# Patient Record
Sex: Female | Born: 1965 | Race: White | Hispanic: No | Smoking: Former smoker
Health system: Southern US, Community
[De-identification: ages and names within clinical notes are randomized; demographics above are authoritative.]

## PROBLEM LIST (undated history)

## (undated) DIAGNOSIS — F419 Anxiety disorder, unspecified: Secondary | ICD-10-CM

## (undated) DIAGNOSIS — F329 Major depressive disorder, single episode, unspecified: Secondary | ICD-10-CM

## (undated) DIAGNOSIS — F32A Depression, unspecified: Secondary | ICD-10-CM

## (undated) DIAGNOSIS — F319 Bipolar disorder, unspecified: Secondary | ICD-10-CM

---

## 1997-03-11 HISTORY — PX: CARPAL TUNNEL RELEASE: SHX101

## 2006-10-03 ENCOUNTER — Ambulatory Visit: Payer: Self-pay | Admitting: Internal Medicine

## 2006-10-03 DIAGNOSIS — F458 Other somatoform disorders: Secondary | ICD-10-CM

## 2006-10-03 DIAGNOSIS — J4520 Mild intermittent asthma, uncomplicated: Secondary | ICD-10-CM

## 2006-10-15 ENCOUNTER — Telehealth (INDEPENDENT_AMBULATORY_CARE_PROVIDER_SITE_OTHER): Payer: Self-pay | Admitting: *Deleted

## 2006-10-22 ENCOUNTER — Ambulatory Visit: Payer: Self-pay | Admitting: Internal Medicine

## 2006-11-29 ENCOUNTER — Inpatient Hospital Stay (HOSPITAL_COMMUNITY): Admission: EM | Admit: 2006-11-29 | Discharge: 2006-11-29 | Payer: Self-pay | Admitting: Emergency Medicine

## 2006-12-04 ENCOUNTER — Encounter (INDEPENDENT_AMBULATORY_CARE_PROVIDER_SITE_OTHER): Payer: Self-pay | Admitting: Internal Medicine

## 2006-12-04 LAB — CONVERTED CEMR LAB
BUN: 8 mg/dL (ref 6–23)
CO2: 23 meq/L (ref 19–32)
Chloride: 106 meq/L (ref 96–112)
Glucose, Bld: 116 mg/dL — ABNORMAL HIGH (ref 70–99)
Hemoglobin: 14.7 g/dL (ref 12.0–15.0)
Lymphocytes Relative: 37 % (ref 12–46)
Lymphs Abs: 3.6 10*3/uL — ABNORMAL HIGH (ref 0.7–3.3)
Monocytes Relative: 7 % (ref 3–11)
Neutro Abs: 5.4 10*3/uL (ref 1.7–7.7)
Neutrophils Relative %: 54 % (ref 43–77)
Potassium: 3.9 meq/L (ref 3.5–5.3)
RBC: 4.57 M/uL (ref 3.87–5.11)
WBC: 9.9 10*3/uL (ref 4.0–10.5)

## 2006-12-05 ENCOUNTER — Telehealth (INDEPENDENT_AMBULATORY_CARE_PROVIDER_SITE_OTHER): Payer: Self-pay | Admitting: Internal Medicine

## 2006-12-15 ENCOUNTER — Ambulatory Visit: Payer: Self-pay | Admitting: Internal Medicine

## 2006-12-15 DIAGNOSIS — Q742 Other congenital malformations of lower limb(s), including pelvic girdle: Secondary | ICD-10-CM

## 2006-12-16 ENCOUNTER — Telehealth (INDEPENDENT_AMBULATORY_CARE_PROVIDER_SITE_OTHER): Payer: Self-pay | Admitting: Internal Medicine

## 2006-12-25 ENCOUNTER — Ambulatory Visit: Payer: Self-pay | Admitting: Orthopedic Surgery

## 2006-12-25 ENCOUNTER — Encounter: Payer: Self-pay | Admitting: Orthopedic Surgery

## 2006-12-25 DIAGNOSIS — M21619 Bunion of unspecified foot: Secondary | ICD-10-CM | POA: Insufficient documentation

## 2007-01-02 ENCOUNTER — Ambulatory Visit: Payer: Self-pay | Admitting: Internal Medicine

## 2007-01-02 DIAGNOSIS — F5102 Adjustment insomnia: Secondary | ICD-10-CM

## 2007-03-09 ENCOUNTER — Ambulatory Visit: Payer: Self-pay | Admitting: Internal Medicine

## 2007-03-09 DIAGNOSIS — L820 Inflamed seborrheic keratosis: Secondary | ICD-10-CM

## 2007-03-09 DIAGNOSIS — L909 Atrophic disorder of skin, unspecified: Secondary | ICD-10-CM | POA: Insufficient documentation

## 2007-03-09 DIAGNOSIS — L919 Hypertrophic disorder of the skin, unspecified: Secondary | ICD-10-CM

## 2007-04-24 ENCOUNTER — Ambulatory Visit: Payer: Self-pay | Admitting: Internal Medicine

## 2007-04-24 DIAGNOSIS — R05 Cough: Secondary | ICD-10-CM

## 2007-04-24 DIAGNOSIS — R519 Headache, unspecified: Secondary | ICD-10-CM | POA: Insufficient documentation

## 2007-04-24 DIAGNOSIS — R51 Headache: Secondary | ICD-10-CM | POA: Insufficient documentation

## 2007-05-15 ENCOUNTER — Encounter (INDEPENDENT_AMBULATORY_CARE_PROVIDER_SITE_OTHER): Payer: Self-pay | Admitting: Internal Medicine

## 2007-05-18 ENCOUNTER — Encounter (INDEPENDENT_AMBULATORY_CARE_PROVIDER_SITE_OTHER): Payer: Self-pay | Admitting: Internal Medicine

## 2007-05-22 ENCOUNTER — Encounter (INDEPENDENT_AMBULATORY_CARE_PROVIDER_SITE_OTHER): Payer: Self-pay | Admitting: Internal Medicine

## 2007-05-25 ENCOUNTER — Encounter (INDEPENDENT_AMBULATORY_CARE_PROVIDER_SITE_OTHER): Payer: Self-pay | Admitting: Internal Medicine

## 2007-06-18 ENCOUNTER — Encounter (INDEPENDENT_AMBULATORY_CARE_PROVIDER_SITE_OTHER): Payer: Self-pay | Admitting: Internal Medicine

## 2008-08-04 ENCOUNTER — Encounter: Admission: RE | Admit: 2008-08-04 | Discharge: 2008-08-04 | Payer: Self-pay | Admitting: Obstetrics and Gynecology

## 2008-09-20 ENCOUNTER — Emergency Department (HOSPITAL_COMMUNITY): Admission: EM | Admit: 2008-09-20 | Discharge: 2008-09-20 | Payer: Self-pay | Admitting: Emergency Medicine

## 2008-10-22 ENCOUNTER — Emergency Department (HOSPITAL_BASED_OUTPATIENT_CLINIC_OR_DEPARTMENT_OTHER): Admission: EM | Admit: 2008-10-22 | Discharge: 2008-10-22 | Payer: Self-pay | Admitting: Emergency Medicine

## 2010-06-17 LAB — COMPREHENSIVE METABOLIC PANEL WITH GFR
ALT: 16 U/L (ref 0–35)
AST: 27 U/L (ref 0–37)
Alkaline Phosphatase: 116 U/L (ref 39–117)
CO2: 30 meq/L (ref 19–32)
Calcium: 9.5 mg/dL (ref 8.4–10.5)
GFR calc Af Amer: >60 mL/min (ref >60)
GFR calc non Af Amer: >60 mL/min (ref >60)
Glucose, Bld: 121 mg/dL — ABNORMAL HIGH (ref 70–99)
Potassium: 3.3 meq/L — ABNORMAL LOW (ref 3.5–5.1)
Sodium: 144 meq/L (ref 135–145)
Total Protein: 6.8 g/dL (ref 6.0–8.3)

## 2010-06-17 LAB — POCT TOXICOLOGY PANEL: Benzodiazepines: POSITIVE

## 2010-06-17 LAB — COMPREHENSIVE METABOLIC PANEL
Albumin: 4 g/dL (ref 3.5–5.2)
BUN: 7 mg/dL (ref 6–23)
Chloride: 114 mEq/L — ABNORMAL HIGH (ref 96–112)
Creatinine, Ser: 0.7 mg/dL (ref 0.4–1.2)
Total Bilirubin: 0.6 mg/dL (ref 0.3–1.2)

## 2010-06-17 LAB — URINALYSIS, ROUTINE W REFLEX MICROSCOPIC
Bilirubin Urine: NEGATIVE
Glucose, UA: NEGATIVE mg/dL
Hgb urine dipstick: NEGATIVE
Ketones, ur: NEGATIVE mg/dL
Nitrite: NEGATIVE
Protein, ur: NEGATIVE mg/dL
Specific Gravity, Urine: 1.006 (ref 1.005–1.030)
Urobilinogen, UA: 0.2 mg/dL (ref 0.0–1.0)
pH: 6 (ref 5.0–8.0)

## 2010-06-17 LAB — CBC
HCT: 45.5 % (ref 36.0–46.0)
Hemoglobin: 15.6 g/dL — ABNORMAL HIGH (ref 12.0–15.0)
MCHC: 34.2 g/dL (ref 30.0–36.0)
MCV: 97.7 fL (ref 78.0–100.0)
Platelets: 295 10*3/uL (ref 150–400)
RBC: 4.07 MIL/uL (ref 3.87–5.11)
RBC: 4.65 MIL/uL (ref 3.87–5.11)
RDW: 12.3 % (ref 11.5–15.5)
WBC: 13.6 10*3/uL — ABNORMAL HIGH (ref 4.0–10.5)
WBC: 16.4 10*3/uL — ABNORMAL HIGH (ref 4.0–10.5)

## 2010-06-17 LAB — DIFFERENTIAL
Lymphs Abs: 1.8 10*3/uL (ref 0.7–4.0)
Monocytes Relative: 3 % (ref 3–12)
Neutro Abs: 14.1 10*3/uL — ABNORMAL HIGH (ref 1.7–7.7)
Neutrophils Relative %: 86 % — ABNORMAL HIGH (ref 43–77)

## 2010-06-17 LAB — TSH: TSH: 1.579 u[IU]/mL (ref 0.350–4.500)

## 2010-06-17 LAB — T4, FREE: Free T4: 1.15 ng/dL (ref 0.80–1.80)

## 2010-06-17 LAB — PREGNANCY, URINE: Preg Test, Ur: NEGATIVE

## 2010-07-24 NOTE — H&P (Signed)
NAMECHARNAY, NAZARIO             ACCOUNT NO.:  [id number]   MEDICAL RECORD NO.:  [id number]          PATIENT TYPE:  INP   LOCATION:  1438                         FACILITY:  [facility]   PHYSICIAN:  Della Goo, M.D. DATE OF BIRTH:  1965/08/15   DATE OF ADMISSION:  11/28/2006  DATE OF DISCHARGE:  11/29/2006                              HISTORY & PHYSICAL   This is an unassigned patient.   CHIEF COMPLAINT:  Altered mental status.   HISTORY OF PRESENT ILLNESS:  This is a 45 year old female, who initially  was unable to give any history of her illness, secondary to minimal  responsiveness.  The patient was arousable at times.  She was brought by  ambulance to the emergency department from [facility].  The patient  had to be worked with and strongly encouraged to answer questions,  during her physical assessment.  She reported having complaints of  constipation and concerns of possibly being obstructed.  She reports  taking a laxative therapy of mag citrate and laxative pills, without  results.  She reports not having a bowel movement for 4 days.  The  patient denies having any nausea, vomiting.   The patient was evaluated in the emergency department by the emergency  room physician, who did a very thorough workup on the patient, without  the help of any medical history.  The patient was administered IV Narcan  x1 dose, without improvement.  The patient then was able to give her  history of having abdominal discomfort and bowel impaction.  She also  reported having a cough for over 3 weeks and taking over-the-counter  cough syrup.  She denied having any production of cough, and she  referred to this cough as being a smoker's cough.   PAST MEDICAL HISTORY:  None.   MEDICATIONS:  None, except for over-the-counter cough syrup.   ALLERGIES:  NO KNOWN DRUG ALLERGIES   SOCIAL HISTORY:  Smoker, nondrinker.   FAMILY HISTORY:  Noncontributory.   REVIEW OF SYSTEMS:  Pertinent for  mentioned above.   PHYSICAL EXAMINATION FINDINGS:  GENERAL:  At the time of my examination,  the patient was alert and oriented x3.  She is tearful and agitated but  in no acute distress.  VITAL SIGNS:  Temperature 97.8, blood pressure  135/80, heart rate 111-112, respirations 18, O2 sats 95% to 98% on room  air.  HEENT:  Examination normocephalic, atraumatic.  There is no  scleral icterus.  Pupils equally round, reactive to light.  Extraocular  muscles are intact.  Funduscopic benign.  Oropharynx is clear.  NECK:  Supple, full range of motion.  No thyromegaly, adenopathy,  jugular venous distention.  CARDIOVASCULAR:  Mild tachycardiac rate and rhythm.  No murmurs, gallops  or rubs.  LUNGS:  Mildly decreased breath sounds.  No rales, rhonchi or wheezes.  ABDOMEN:  Positive bowel sounds, but mildly hypoactive.  Nontender on  palpation.  No hepatosplenomegaly.  No rebound or guarding.  EXTREMITIES:  Without cyanosis, clubbing or edema.  NEUROLOGIC:  The patient now it is alert and oriented x3.  She is able  to move all  four of her extremities, and there are no focal deficits.   LABORATORY STUDIES:  Reveal a sodium level of 127, a potassium 2.6, a  chloride of 93, carbon dioxide 25, BUN 3, creatinine 0.65 and glucose  140. Urinalysis negative, urine drug screen performed.  The initial  urine drug screen returned positive for opiates and positive for  amphetamines.  This was while the patient was obtunded.  This was sent,  and the results returned positive.  The patient denied any history of  any drug usage or prescription medication and wanted a repeat of the  drug screen performed.  The repeat drug screen returned negative.  This  must have been because of lab error.  X-ray studies performed of the  abdomen revealed a mild ileus, no obstruction, no free air.  Chest x-ray  findings of the series revealed bibasilar atelectasis.   ASSESSMENT:  94. A 45 year old female being admitted with  altered mental status.  2. Bibasilar pneumonia.  3. Hyponatremia.  4. Hypokalemia.  5. Mild ileus.   PLAN:  The patient will be admitted and placed on IV antibiotic therapy  of Avelox, along with nebulizer treatments.  Supplemental oxygen has  been ordered as needed.  Her electrolytes will be replaced.  The patient  will be placed on DVT and GI prophylaxis, and IV Reglan therapy will be  started.  A psychiatric consultation will also be considered.      Della Goo, M.D.  Electronically Signed     HJ/MEDQ  D:  11/29/2006  T:  11/30/2006  Job:  [id number]

## 2010-12-20 LAB — URINE CULTURE
Colony Count: NO GROWTH
Culture: NO GROWTH

## 2010-12-20 LAB — RAPID URINE DRUG SCREEN, HOSP PERFORMED
Amphetamines: POSITIVE — AB
Barbiturates: NOT DETECTED
Benzodiazepines: NOT DETECTED
Opiates: NOT DETECTED
Opiates: POSITIVE — AB
Tetrahydrocannabinol: NOT DETECTED

## 2010-12-20 LAB — BASIC METABOLIC PANEL
BUN: 5 — ABNORMAL LOW
Chloride: 110
Potassium: 4.3
Sodium: 139

## 2010-12-20 LAB — URINALYSIS, ROUTINE W REFLEX MICROSCOPIC
Glucose, UA: NEGATIVE
Hgb urine dipstick: NEGATIVE
Specific Gravity, Urine: 1.028
Urobilinogen, UA: 0.2

## 2010-12-20 LAB — COMPREHENSIVE METABOLIC PANEL
ALT: 11
Calcium: 8.5
Creatinine, Ser: 0.65
GFR calc non Af Amer: >60
Glucose, Bld: 140 — ABNORMAL HIGH
Sodium: 127 — ABNORMAL LOW
Total Protein: 6

## 2010-12-20 LAB — DIFFERENTIAL
Basophils Relative: 1
Eosinophils Relative: 0
Lymphocytes Relative: 6 — ABNORMAL LOW
Monocytes Absolute: 0.8 — ABNORMAL HIGH
Monocytes Relative: 3
Neutrophils Relative %: 90 — ABNORMAL HIGH

## 2010-12-20 LAB — SODIUM, URINE, RANDOM: Sodium, Ur: 41

## 2010-12-20 LAB — CBC
HCT: 37.6
Hemoglobin: 13
Hemoglobin: 14.2
MCHC: 35
MCV: 96.2
MCV: 96.2
Platelets: 358
RBC: 3.91
RDW: 12.7
WBC: 14.2 — ABNORMAL HIGH

## 2010-12-20 LAB — CK TOTAL AND CKMB (NOT AT ARMC)
CK, MB: 1.9
Relative Index: 0.6
Total CK: 316 — ABNORMAL HIGH

## 2010-12-20 LAB — CULTURE, BLOOD (ROUTINE X 2): Culture: NO GROWTH

## 2010-12-20 LAB — TROPONIN I: Troponin I: 0.03

## 2010-12-20 LAB — TSH: TSH: 1.203

## 2010-12-20 LAB — CHLORIDE, URINE, RANDOM: Chloride Urine: 38

## 2013-01-14 ENCOUNTER — Encounter (HOSPITAL_COMMUNITY): Payer: Self-pay | Admitting: Emergency Medicine

## 2013-01-14 ENCOUNTER — Emergency Department (HOSPITAL_COMMUNITY)
Admission: EM | Admit: 2013-01-14 | Discharge: 2013-01-14 | Disposition: A | Discharge status: Home / Self Care | Payer: Medicare Other | Source: Home / Self Care | Attending: Emergency Medicine | Admitting: Emergency Medicine

## 2013-01-14 DIAGNOSIS — G44209 Tension-type headache, unspecified, not intractable: Secondary | ICD-10-CM

## 2013-01-14 HISTORY — DX: Bipolar disorder, unspecified: F31.9

## 2013-01-14 HISTORY — DX: Depression, unspecified: F32.A

## 2013-01-14 HISTORY — DX: Major depressive disorder, single episode, unspecified: F32.9

## 2013-01-14 MED ORDER — BUTALBITAL-APAP-CAFFEINE 50-325-40 MG PO TABS: 1.0000 | ORAL_TABLET | Freq: Four times a day (QID) | ORAL | Status: AC | PRN

## 2013-01-14 NOTE — ED Provider Notes (Signed)
CSN: [id number]     Arrival date & time 01/14/13  1719 History   First MD Initiated Contact with Patient 01/14/13 1851     Chief Complaint  Patient presents with  . Headache   (Consider location/radiation/quality/duration/timing/severity/associated sxs/prior Treatment) HPI Comments: 47 year old female presents for evaluation of headaches. She states that she always gets a headache around this time of year. She has a headache in her forehead and in the back of her head, bilateral, for the past 5 days. She feels slightly nauseous as well but has not vomited any. Blurry vision, fever, chills, NVD, lightheadedness. Headache is rated 5/10 in severity. It is constant, nonpulsatile, not worsening.  Patient is a 47 y.o. female presenting with headaches.  Headache Associated symptoms: no abdominal pain, no cough, no dizziness, no fever, no myalgias, no nausea and no vomiting     Past Medical History  Diagnosis Date  . Bipolar 1 disorder   . Depression    Past Surgical History  Procedure Laterality Date  . Carpal tunnel release Bilateral 1999   Family History  Problem Relation Age of Onset  . Heart attack Mother   . Heart attack Father    History  Substance Use Topics  . Smoking status: Current Every Day Smoker -- 0.50 packs/day    Types: Cigarettes  . Smokeless tobacco: Not on file  . Alcohol Use: No   OB History   Grav Para Term Preterm Abortions TAB SAB Ect Mult Living                 Review of Systems  Constitutional: Negative for fever and chills.  Eyes: Negative for visual disturbance.  Respiratory: Negative for cough and shortness of breath.   Cardiovascular: Negative for chest pain, palpitations and leg swelling.  Gastrointestinal: Negative for nausea, vomiting and abdominal pain.  Endocrine: Negative for polydipsia and polyuria.  Genitourinary: Negative for dysuria, urgency and frequency.  Musculoskeletal: Negative for arthralgias and myalgias.  Skin: Negative for  rash.  Neurological: Positive for headaches. Negative for dizziness, weakness and light-headedness.    Allergies  Review of patient's allergies indicates no known allergies.  Home Medications   Current Outpatient Rx  Name  Route  Sig  Dispense  Refill  . buPROPion (WELLBUTRIN SR) 100 MG 12 hr tablet   Oral   Take 100 mg by mouth 2 (two) times daily.         . risperiDONE (RISPERDAL) 0.5 MG tablet   Oral   Take 0.5 mg by mouth 2 (two) times daily.         . sertraline (ZOLOFT) 100 MG tablet   Oral   Take 200 mg by mouth daily.         . butalbital-acetaminophen-caffeine (FIORICET) 50-325-40 MG per tablet   Oral   Take 1-2 tablets by mouth every 6 (six) hours as needed for headache.   20 tablet   0    BP 119/71  Pulse 90  Temp(Src) 98.7 F (37.1 C) (Oral)  Resp 16  SpO2 98% Physical Exam  Nursing note and vitals reviewed. Constitutional: She is oriented to person, place, and time. Vital signs are normal. She appears well-developed and well-nourished. No distress.  HENT:  Head: Normocephalic and atraumatic.  Right Ear: External ear normal.  Left Ear: External ear normal.  Mouth/Throat: Oropharynx is clear and moist. No oropharyngeal exudate.  Pulmonary/Chest: Effort normal. No respiratory distress.  Neurological: She is alert and oriented to person, place, and time. She has  normal strength and normal reflexes. She displays normal reflexes. No cranial nerve deficit or sensory deficit. She exhibits normal muscle tone. Coordination normal.  Skin: Skin is warm and dry. No rash noted. She is not diaphoretic.  Psychiatric: She has a normal mood and affect. Judgment normal.    ED Course  Procedures (including critical care time) Labs Review Labs Reviewed - No data to display Imaging Review No results found.    MDM   1. Tension headache    Treating with fioricet. Followup PRN       Graylon Good, PA-C 01/14/13 1910

## 2013-01-14 NOTE — ED Provider Notes (Signed)
Medical screening examination/treatment/procedure(s) were performed by a resident physician and as supervising physician I was immediately available for consultation/collaboration.  Leslee Home, M.D.  Reuben Likes, MD 01/14/13 2118

## 2013-01-14 NOTE — ED Notes (Signed)
C/o headache onset 5 days ago.  States she usually gets more headaches this time of year.  Has tried Tylenol, Sudafed and Benadryl with relief for 1 hr.  Pain in back of head and forehead.  Has a a little nausea.

## 2013-01-16 ENCOUNTER — Telehealth (HOSPITAL_COMMUNITY): Payer: Self-pay

## 2013-01-16 NOTE — ED Notes (Signed)
Patient states the Fioricet is not helping her headache as it has in the past.  Chart reviewed by Almedia Balls PA.  Patient given RX for Ibuprofen 800mg  1 TID qty 20 and 6 day Medrol dose pack use a directed Qty 1 both without refills.  Patient was called back.  Patient states she has been taking Advil and home and it has not helped.  Patient requesting we call in" Hydrocodone".  Patient made aware that was a controlled substance and we could not call that in.  Patient states she did not want to take a steroid.  Patient still requesting "something stronger"  Patient made aware we could not call in anything strong than what she was given.  Patient expressed understanding

## 2013-03-18 ENCOUNTER — Encounter: Payer: Self-pay | Admitting: Internal Medicine

## 2013-03-18 ENCOUNTER — Ambulatory Visit: Payer: Medicare Other | Attending: Internal Medicine | Admitting: Internal Medicine

## 2013-03-18 VITALS — BP 143/89 | HR 87 | Temp 99.0°F | Resp 16 | Ht 64.0 in | Wt 175.0 lb

## 2013-03-18 DIAGNOSIS — F17200 Nicotine dependence, unspecified, uncomplicated: Secondary | ICD-10-CM | POA: Insufficient documentation

## 2013-03-18 DIAGNOSIS — E785 Hyperlipidemia, unspecified: Secondary | ICD-10-CM | POA: Insufficient documentation

## 2013-03-18 DIAGNOSIS — J209 Acute bronchitis, unspecified: Secondary | ICD-10-CM | POA: Insufficient documentation

## 2013-03-18 MED ORDER — PREDNISONE 10 MG PO TABS: 10.0000 mg | ORAL_TABLET | Freq: Every day | ORAL | Status: DC

## 2013-03-18 MED ORDER — GUAIFENESIN-DM 100-10 MG/5ML PO SYRP: 5.0000 mL | ORAL_SOLUTION | ORAL | Status: DC | PRN

## 2013-03-18 MED ORDER — ALBUTEROL SULFATE HFA 108 (90 BASE) MCG/ACT IN AERS: 2.0000 | INHALATION_SPRAY | Freq: Four times a day (QID) | RESPIRATORY_TRACT | Status: DC | PRN

## 2013-03-18 NOTE — Patient Instructions (Signed)
Fat and Cholesterol Control Diet Fat and cholesterol levels in your blood and organs are influenced by your diet. High levels of fat and cholesterol may lead to diseases of the heart, small and large blood vessels, gallbladder, liver, and pancreas. CONTROLLING FAT AND CHOLESTEROL WITH DIET Although exercise and lifestyle factors are important, your diet is key. That is because certain foods are known to raise cholesterol and others to lower it. The goal is to balance foods for their effect on cholesterol and more importantly, to replace saturated and trans fat with other types of fat, such as monounsaturated fat, polyunsaturated fat, and omega-3 fatty acids. On average, a person should consume no more than 15 to 17 g of saturated fat daily. Saturated and trans fats are considered "bad" fats, and they will raise LDL cholesterol. Saturated fats are primarily found in animal products such as meats, butter, and cream. However, that does not mean you need to give up all your favorite foods. Today, there are good tasting, low-fat, low-cholesterol substitutes for most of the things you like to eat. Choose low-fat or nonfat alternatives. Choose round or loin cuts of red meat. These types of cuts are lowest in fat and cholesterol. Chicken (without the skin), fish, veal, and ground turkey breast are great choices. Eliminate fatty meats, such as hot dogs and salami. Even shellfish have little or no saturated fat. Have a 3 oz (85 g) portion when you eat lean meat, poultry, or fish. Trans fats are also called "partially hydrogenated oils." They are oils that have been scientifically manipulated so that they are solid at room temperature resulting in a longer shelf life and improved taste and texture of foods in which they are added. Trans fats are found in stick margarine, some tub margarines, cookies, crackers, and baked goods.  When baking and cooking, oils are a great substitute for butter. The monounsaturated oils are  especially beneficial since it is believed they lower LDL and raise HDL. The oils you should avoid entirely are saturated tropical oils, such as coconut and palm.  Remember to eat a lot from food groups that are naturally free of saturated and trans fat, including fish, fruit, vegetables, beans, grains (barley, rice, couscous, bulgur wheat), and pasta (without cream sauces).  IDENTIFYING FOODS THAT LOWER FAT AND CHOLESTEROL  Soluble fiber may lower your cholesterol. This type of fiber is found in fruits such as apples, vegetables such as broccoli, potatoes, and carrots, legumes such as beans, peas, and lentils, and grains such as barley. Foods fortified with plant sterols (phytosterol) may also lower cholesterol. You should eat at least 2 g per day of these foods for a cholesterol lowering effect.  Read package labels to identify low-saturated fats, trans fat free, and low-fat foods at the supermarket. Select cheeses that have only 2 to 3 g saturated fat per ounce. Use a heart-healthy tub margarine that is free of trans fats or partially hydrogenated oil. When buying baked goods (cookies, crackers), avoid partially hydrogenated oils. Breads and muffins should be made from whole grains (whole-wheat or whole oat flour, instead of "flour" or "enriched flour"). Buy non-creamy canned soups with reduced salt and no added fats.  FOOD PREPARATION TECHNIQUES  Never deep-fry. If you must fry, either stir-fry, which uses very little fat, or use non-stick cooking sprays. When possible, broil, bake, or roast meats, and steam vegetables. Instead of putting butter or margarine on vegetables, use lemon and herbs, applesauce, and cinnamon (for squash and sweet potatoes). Use nonfat   yogurt, salsa, and low-fat dressings for salads.  LOW-SATURATED FAT / LOW-FAT FOOD SUBSTITUTES Meats / Saturated Fat (g)  Avoid: Steak, marbled (3 oz/85 g) / 11 g  Choose: Steak, lean (3 oz/85 g) / 4 g  Avoid: Hamburger (3 oz/85 g) / 7  g  Choose: Hamburger, lean (3 oz/85 g) / 5 g  Avoid: Ham (3 oz/85 g) / 6 g  Choose: Ham, lean cut (3 oz/85 g) / 2.4 g  Avoid: Chicken, with skin, dark meat (3 oz/85 g) / 4 g  Choose: Chicken, skin removed, dark meat (3 oz/85 g) / 2 g  Avoid: Chicken, with skin, light meat (3 oz/85 g) / 2.5 g  Choose: Chicken, skin removed, light meat (3 oz/85 g) / 1 g Dairy / Saturated Fat (g)  Avoid: Whole milk (1 cup) / 5 g  Choose: Low-fat milk, 2% (1 cup) / 3 g  Choose: Low-fat milk, 1% (1 cup) / 1.5 g  Choose: Skim milk (1 cup) / 0.3 g  Avoid: Hard cheese (1 oz/28 g) / 6 g  Choose: Skim milk cheese (1 oz/28 g) / 2 to 3 g  Avoid: Cottage cheese, 4% fat (1 cup) / 6.5 g  Choose: Low-fat cottage cheese, 1% fat (1 cup) / 1.5 g  Avoid: Ice cream (1 cup) / 9 g  Choose: Sherbet (1 cup) / 2.5 g  Choose: Nonfat frozen yogurt (1 cup) / 0.3 g  Choose: Frozen fruit bar / trace  Avoid: Whipped cream (1 tbs) / 3.5 g  Choose: Nondairy whipped topping (1 tbs) / 1 g Condiments / Saturated Fat (g)  Avoid: Mayonnaise (1 tbs) / 2 g  Choose: Low-fat mayonnaise (1 tbs) / 1 g  Avoid: Butter (1 tbs) / 7 g  Choose: Extra light margarine (1 tbs) / 1 g  Avoid: Coconut oil (1 tbs) / 11.8 g  Choose: Olive oil (1 tbs) / 1.8 g  Choose: Corn oil (1 tbs) / 1.7 g  Choose: Safflower oil (1 tbs) / 1.2 g  Choose: Sunflower oil (1 tbs) / 1.4 g  Choose: Soybean oil (1 tbs) / 2.4 g  Choose: Canola oil (1 tbs) / 1 g Document Released: 02/25/2005 Document Revised: 06/22/2012 Document Reviewed: 08/16/2010 [employer] Patient Information 2014 [employer] DASH Diet The DASH diet stands for "Dietary Approaches to Stop Hypertension." It is a healthy eating plan that has been shown to reduce high blood pressure (hypertension) in as little as 14 days, while also possibly providing other significant health benefits. These other health benefits include reducing the risk of breast cancer after menopause and  reducing the risk of type 2 diabetes, heart disease, colon cancer, and stroke. Health benefits also include weight loss and slowing kidney failure in patients with chronic kidney disease.  DIET GUIDELINES  Limit salt (sodium). Your diet should contain less than 1500 mg of sodium daily.  Limit refined or processed carbohydrates. Your diet should include mostly whole grains. Desserts and added sugars should be used sparingly.  Include small amounts of heart-healthy fats. These types of fats include nuts, oils, and tub margarine. Limit saturated and trans fats. These fats have been shown to be harmful in the body. CHOOSING FOODS  The following food groups are based on a 2000 calorie diet. See your Registered Dietitian for individual calorie needs. Grains and Grain Products (6 to 8 servings daily)  Eat More Often: Whole-wheat bread, brown rice, whole-grain or wheat pasta, quinoa, popcorn without added fat or salt (air   popped).  Eat Less Often: White bread, white pasta, white rice, cornbread. Vegetables (4 to 5 servings daily)  Eat More Often: Fresh, frozen, and canned vegetables. Vegetables may be raw, steamed, roasted, or grilled with a minimal amount of fat.  Eat Less Often/Avoid: Creamed or fried vegetables. Vegetables in a cheese sauce. Fruit (4 to 5 servings daily)  Eat More Often: All fresh, canned (in natural juice), or frozen fruits. Dried fruits without added sugar. One hundred percent fruit juice ( cup [237 mL] daily).  Eat Less Often: Dried fruits with added sugar. Canned fruit in light or heavy syrup. Lean Meats, Fish, and Poultry (2 servings or less daily. One serving is 3 to 4 oz [85-114 g]).  Eat More Often: Ninety percent or leaner ground beef, tenderloin, sirloin. Round cuts of beef, chicken breast, turkey breast. All fish. Grill, bake, or broil your meat. Nothing should be fried.  Eat Less Often/Avoid: Fatty cuts of meat, turkey, or chicken leg, thigh, or wing. Fried cuts  of meat or fish. Dairy (2 to 3 servings)  Eat More Often: Low-fat or fat-free milk, low-fat plain or light yogurt, reduced-fat or part-skim cheese.  Eat Less Often/Avoid: Milk (whole, 2%).Whole milk yogurt. Full-fat cheeses. Nuts, Seeds, and Legumes (4 to 5 servings per week)  Eat More Often: All without added salt.  Eat Less Often/Avoid: Salted nuts and seeds, canned beans with added salt. Fats and Sweets (limited)  Eat More Often: Vegetable oils, tub margarines without trans fats, sugar-free gelatin. Mayonnaise and salad dressings.  Eat Less Often/Avoid: Coconut oils, palm oils, butter, stick margarine, cream, half and half, cookies, candy, pie. FOR MORE INFORMATION The Dash Diet Eating Plan: www.dashdiet.org Document Released: 02/14/2011 Document Revised: 05/20/2011 Document Reviewed: 02/14/2011 [employer] Patient Information 2014 [employer].  

## 2013-03-18 NOTE — Progress Notes (Signed)
Patient ID: Linda Nicholson, female   DOB: 1965/07/16, 48 y.o.   MRN: [id number] Patient Demographics  Linda Nicholson, is a 48 y.o. female  [id number]  [id number]  DOB - 1965/07/16  CC:  Chief Complaint  Patient presents with  . Establish Care       HPI: Linda Nicholson is a 48 y.o. female here today to establish medical care. Patient has history of bipolar disorder and depression on Zoloft, risperidone and Wellbutrin. She also has history of bronchitis with occasional flareup, she had been treated previously with steroid and the albuterol inhaler but for a while now she has none of these medications and for the past one week she has been coughing more generally productive of whitish sputum, she also noticed is that she is wheezing. She denies chest pain. No shortness of breath. She continues to smoke at least half a pack per day, she claims she has reduced the quantity of smoking from one pack to half pack per day, she does not drink alcohol. She goes through mental health department for her psych medications and followup. She denies any suicidal ideation or attempt at the present. No nausea or vomiting, no diarrhea. She is not known to have hypertension or diabetes and no family history of such except that her father died of heart attack at the age of 50 when she was 48 years old. Her father used to be a polysubstance abuse per patient.   Patient has No headache, No chest pain, No abdominal pain - No Nausea, No new weakness tingling or numbness.   No Known Allergies Past Medical History  Diagnosis Date  . Bipolar 1 disorder   . Depression    Current Outpatient Prescriptions on File Prior to Visit  Medication Sig Dispense Refill  . buPROPion (WELLBUTRIN SR) 100 MG 12 hr tablet Take 100 mg by mouth 2 (two) times daily.      . risperiDONE (RISPERDAL) 0.5 MG tablet Take 0.5 mg by mouth 2 (two) times daily.      . sertraline (ZOLOFT) 100 MG tablet Take 200 mg by mouth daily.      .  butalbital-acetaminophen-caffeine (FIORICET) 50-325-40 MG per tablet Take 1-2 tablets by mouth every 6 (six) hours as needed for headache.  20 tablet  0   No current facility-administered medications on file prior to visit.   Family History  Problem Relation Age of Onset  . Heart attack Mother   . Heart attack Father    History   Social History  . Marital Status: Divorced    Spouse Name: N/A    Number of Children: N/A  . Years of Education: N/A   Occupational History  . Not on file.   Social History Main Topics  . Smoking status: Current Every Day Smoker -- 0.50 packs/day    Types: Cigarettes  . Smokeless tobacco: Not on file  . Alcohol Use: No  . Drug Use: No  . Sexual Activity: Yes    Birth Control/ Protection: None   Other Topics Concern  . Not on file   Social History Narrative  . No narrative on file    Review of Systems: Constitutional: Negative for fever, chills, diaphoresis, activity change, appetite change and fatigue. HENT: Negative for ear pain, nosebleeds, congestion, facial swelling, rhinorrhea, neck pain, neck stiffness and ear discharge.  Eyes: Negative for pain, discharge, redness, itching and visual disturbance. Cardiovascular: Negative for chest pain, palpitations and leg swelling. Gastrointestinal: Negative for abdominal distention. Genitourinary: Negative for  dysuria, urgency, frequency, hematuria, flank pain, decreased urine volume, difficulty urinating and dyspareunia.  Musculoskeletal: Negative for back pain, joint swelling, arthralgia and gait problem. Neurological: Negative for dizziness, tremors, seizures, syncope, facial asymmetry, speech difficulty, weakness, light-headedness, numbness and headaches.  Hematological: Negative for adenopathy. Does not bruise/bleed easily. Psychiatric/Behavioral: Negative for hallucinations, behavioral problems, confusion, dysphoric mood, decreased concentration and agitation.    Objective:   Filed Vitals:    03/18/13 1456  BP: 143/89  Pulse: 87  Temp: 99 F (37.2 C)  Resp: 16    Physical Exam: Constitutional: Patient appears well-developed and well-nourished. No distress. HENT: Normocephalic, atraumatic, External right and left ear normal. Oropharynx is clear and moist.  Eyes: Conjunctivae and EOM are normal. PERRLA, no scleral icterus. Neck: Normal ROM. Neck supple. No JVD. No tracheal deviation. No thyromegaly. CVS: RRR, S1/S2 +, no murmurs, no gallops, no carotid bruit.  Pulmonary: Bilateral rhonchi and wheezes, no rales, no crepitations or crackles Abdominal: Soft. BS +, no distension, tenderness, rebound or guarding.  Musculoskeletal: Normal range of motion. No edema and no tenderness.  Lymphadenopathy: No lymphadenopathy noted, cervical, inguinal or axillary Neuro: Alert. Normal reflexes, muscle tone coordination. No cranial nerve deficit. Skin: Skin is warm and dry. No rash noted. Not diaphoretic. No erythema. No pallor. Psychiatric: Normal mood and affect. Behavior, judgment, thought content normal.  Lab Results  Component Value Date   WBC 13.6* 10/22/2008   HGB 15.6* 10/22/2008   HCT 45.5 10/22/2008   MCV 97.7 10/22/2008   PLT 295 10/22/2008   Lab Results  Component Value Date   CREATININE .7 10/22/2008   BUN 7 10/22/2008   NA 144 10/22/2008   K 3.3* 10/22/2008   CL 114* 10/22/2008   CO2 30 10/22/2008    No results found for this basename: HGBA1C   Lipid Panel  No results found for this basename: chol, trig, hdl, cholhdl, vldl, ldlcalc       Assessment and plan:   1. Acute bronchitis  - predniSONE (DELTASONE) 10 MG tablet; Take 1 tablet (10 mg total) by mouth daily with breakfast.  Dispense: 5 tablet; Refill: 0 - guaiFENesin-dextromethorphan (ROBITUSSIN DM) 100-10 MG/5ML syrup; Take 5 mLs by mouth every 4 (four) hours as needed for cough.  Dispense: 118 mL; Refill: 0 - albuterol (PROVENTIL HFA;VENTOLIN HFA) 108 (90 BASE) MCG/ACT inhaler; Inhale 2 puffs into the lungs  every 6 (six) hours as needed for wheezing or shortness of breath.  Dispense: 1 Inhaler; Refill: 2  2. Dyslipidemia Patient was extensively counseled on nutrition and exercise  Patient has been counseled extensively on smoking cessation Patient was encouraged to follow with mental health for her other psych medications  Follow up in 3 months or when necessary   The patient was given clear instructions to go to ER or return to medical center if symptoms don't improve, worsen or new problems develop. The patient verbalized understanding. The patient was told to call to get lab results if they haven't heard anything in the next week.     Jeanann Lewandowsky, MD, MHA, [facility] [place], [place] [phone]   03/18/2013, 3:35 PM

## 2013-03-18 NOTE — Progress Notes (Signed)
Pt is here today to establish care. Pt has had bronchitis for 10 days.

## 2013-03-24 ENCOUNTER — Telehealth: Payer: Self-pay | Admitting: Internal Medicine

## 2013-03-24 LAB — CYTOLOGY - PAP
HPV DNA High Risk: NOT DETECTED
Pap: NEGATIVE

## 2013-03-24 NOTE — Telephone Encounter (Signed)
Pt called regarding a perscription, pt would like for the doctor to call in Prednisone. Please call pt

## 2013-03-26 NOTE — Telephone Encounter (Signed)
Pt says she has been calling since Monday and has not received call back about refill. Says not feeling better and needs refill for predniSONE (DELTASONE) 10 MG tablet. Also says inhaler has not been effective and would like to switched to one she used in the past. Please f/u with pt.

## 2013-03-29 ENCOUNTER — Telehealth: Payer: Self-pay | Admitting: Internal Medicine

## 2013-03-29 NOTE — Telephone Encounter (Signed)
Pt called regarding a refill of her medication predniSONE (DELTASONE) 10 MG, please contact pt

## 2013-03-29 NOTE — Telephone Encounter (Signed)
Patient requesting refill on prednisone Is it all right to refill or does she need to be seen

## 2013-04-05 ENCOUNTER — Telehealth: Payer: Self-pay | Admitting: Internal Medicine

## 2013-04-05 NOTE — Telephone Encounter (Signed)
Pt calling about predniSONE (DELTASONE) 10 MG tablet refill. Says she has been calling for 2 weeks and has not received a call back from a nurse.  Please f/u with pt.

## 2013-04-05 NOTE — Telephone Encounter (Signed)
Spoke  with pt regarding medication refill Prednisone 10 mg tab. States she called in for 2 weeks for refill but no response. Pt is very upset and refused refill until next appt 04/08/13.

## 2013-04-08 ENCOUNTER — Ambulatory Visit: Payer: Medicare Other | Attending: Internal Medicine | Admitting: Internal Medicine

## 2013-04-08 ENCOUNTER — Encounter: Payer: Self-pay | Admitting: Internal Medicine

## 2013-04-08 VITALS — BP 131/86 | HR 97 | Temp 98.7°F | Resp 16 | Ht 64.0 in | Wt 181.0 lb

## 2013-04-08 DIAGNOSIS — M169 Osteoarthritis of hip, unspecified: Secondary | ICD-10-CM

## 2013-04-08 DIAGNOSIS — J209 Acute bronchitis, unspecified: Secondary | ICD-10-CM | POA: Insufficient documentation

## 2013-04-08 DIAGNOSIS — M1612 Unilateral primary osteoarthritis, left hip: Secondary | ICD-10-CM | POA: Insufficient documentation

## 2013-04-08 DIAGNOSIS — E785 Hyperlipidemia, unspecified: Secondary | ICD-10-CM | POA: Insufficient documentation

## 2013-04-08 DIAGNOSIS — M1610 Unilateral primary osteoarthritis, unspecified hip: Secondary | ICD-10-CM

## 2013-04-08 MED ORDER — PREDNISONE 10 MG PO TABS: 10.0000 mg | ORAL_TABLET | Freq: Every day | ORAL | Status: DC

## 2013-04-08 MED ORDER — ALBUTEROL SULFATE HFA 108 (90 BASE) MCG/ACT IN AERS: 2.0000 | INHALATION_SPRAY | Freq: Four times a day (QID) | RESPIRATORY_TRACT | Status: DC | PRN

## 2013-04-08 MED ORDER — ACETAMINOPHEN-CODEINE #3 300-30 MG PO TABS: 1.0000 | ORAL_TABLET | ORAL | Status: DC | PRN

## 2013-04-08 NOTE — Progress Notes (Signed)
Patient ID: Linda Nicholson, female   DOB: 09-01-1965, 48 y.o.   MRN: [id number] Patient Demographics  Linda Nicholson, is a 48 y.o. female  [id number]  [id number]  DOB - 09-01-1965  Chief Complaint  Patient presents with  . Follow-up        Subjective:   Linda LawrenceHeather Nicholson is a 48 y.o. female here today for a follow up visit. Patient has a history of bipolar disorder and major depression, as well as chronic bronchitis. Patient with the same complaints of cough as soon as she stopped using prednisone. She however continue to smoke at least half a pack per day of cigarette despite repeated counseling. She coughs repeatedly and sometimes feels that she had pulled a muscle in her back from excessive coughing, cough is productive of whitish sputum. No fever. No cold tablet patient has chronic cough. She did not endorse any weight loss. She is trying to quit smoking. At the moment she has no shortness of breath. She needs a refill on her inhalers and breathing treatment. Patient has No headache, No chest pain, No abdominal pain - No Nausea, No new weakness tingling or numbness, No Cough - SOB.  ALLERGIES: No Known Allergies  PAST MEDICAL HISTORY: Past Medical History  Diagnosis Date  . Bipolar 1 disorder   . Depression     MEDICATIONS AT HOME: Prior to Admission medications   Medication Sig Start Date End Date Taking? Authorizing Provider  buPROPion (WELLBUTRIN SR) 100 MG 12 hr tablet Take 100 mg by mouth 2 (two) times daily.   Yes Historical Provider, MD  guaiFENesin-dextromethorphan (ROBITUSSIN DM) 100-10 MG/5ML syrup Take 5 mLs by mouth every 4 (four) hours as needed for cough. 03/18/13  Yes Jeanann Lewandowskylugbemiga Aurie Harroun, MD  risperiDONE (RISPERDAL) 0.5 MG tablet Take 0.5 mg by mouth 2 (two) times daily.   Yes Historical Provider, MD  sertraline (ZOLOFT) 100 MG tablet Take 200 mg by mouth daily.   Yes Historical Provider, MD  albuterol (PROVENTIL HFA;VENTOLIN HFA) 108 (90 BASE) MCG/ACT  inhaler Inhale 2 puffs into the lungs every 6 (six) hours as needed for wheezing or shortness of breath. 04/08/13   Jeanann Lewandowskylugbemiga Aamirah Salmi, MD  butalbital-acetaminophen-caffeine (FIORICET) 50-325-40 MG per tablet Take 1-2 tablets by mouth every 6 (six) hours as needed for headache. 01/14/13 01/14/14  Graylon GoodZachary H Baker, PA-C  predniSONE (DELTASONE) 10 MG tablet Take 1 tablet (10 mg total) by mouth daily with breakfast. 04/08/13   Jeanann Lewandowskylugbemiga Ader Fritze, MD     Objective:   Filed Vitals:   04/08/13 1611  BP: 131/86  Pulse: 97  Temp: 98.7 F (37.1 C)  TempSrc: Oral  Resp: 16  Height: 5\' 4"  (1.626 m)  Weight: 181 lb (82.101 kg)  SpO2: 96%    Exam General appearance : Awake, alert, not in any distress. Speech Clear. Not toxic looking HEENT: Atraumatic and Normocephalic, pupils equally reactive to light and accomodation Neck: supple, no JVD. No cervical lymphadenopathy.  Chest:Good air entry bilaterally, no added sounds  CVS: S1 S2 regular, no murmurs.  Abdomen: Bowel sounds present, Non tender and not distended with no gaurding, rigidity or rebound. Extremities: B/L Lower Ext shows no edema, both legs are warm to touch Neurology: Awake alert, and oriented X 3, CN II-XII intact, Non focal Skin:No Rash Wounds:N/A   Data Review   CBC No results found for this basename: WBC, HGB, HCT, PLT, MCV, MCH, MCHC, RDW, NEUTRABS, LYMPHSABS, MONOABS, EOSABS, BASOSABS, BANDABS, BANDSABD,  in the last 168 hours  Chemistries  No results found for this basename: NA, K, CL, CO2, GLUCOSE, BUN, CREATININE, GFRCGP, CALCIUM, MG, AST, ALT, ALKPHOS, BILITOT,  in the last 168 hours ------------------------------------------------------------------------------------------------------------------ No results found for this basename: HGBA1C,  in the last 72 hours ------------------------------------------------------------------------------------------------------------------ No results found for this basename: CHOL, HDL,  LDLCALC, TRIG, CHOLHDL, LDLDIRECT,  in the last 72 hours ------------------------------------------------------------------------------------------------------------------ No results found for this basename: TSH, T4TOTAL, FREET3, T3FREE, THYROIDAB,  in the last 72 hours ------------------------------------------------------------------------------------------------------------------ No results found for this basename: VITAMINB12, FOLATE, FERRITIN, TIBC, IRON, RETICCTPCT,  in the last 72 hours  Coagulation profile  No results found for this basename: INR, PROTIME,  in the last 168 hours    Assessment & Plan   1. Acute bronchitis  - predniSONE (DELTASONE) 10 MG tablet; Take 1 tablet (10 mg total) by mouth daily with breakfast.  Dispense: 10 tablet; Refill: 0 - albuterol (PROVENTIL HFA;VENTOLIN HFA) 108 (90 BASE) MCG/ACT inhaler; Inhale 2 puffs into the lungs every 6 (six) hours as needed for wheezing or shortness of breath.  Dispense: 1 Inhaler; Refill: 3 Patient was counseled extensively on smoking cessation  - DG Chest 2 View; Future  2. Dyslipidemia Patient has been counseled extensively on nutrition and exercise    Follow up in 3 months or when necessary  The patient was given clear instructions to go to ER or return to medical center if symptoms don't improve, worsen or new problems develop. The patient verbalized understanding. The patient was told to call to get lab results if they haven't heard anything in the next week.    Jeanann Lewandowsky, MD, MHA, FACP, FAAP [facility] [place], [place] [phone]   04/08/2013, 4:39 PM

## 2013-04-08 NOTE — Progress Notes (Signed)
Pt is here today because she has pulled her back due to extreme coughing.

## 2013-04-08 NOTE — Patient Instructions (Signed)

## 2013-06-08 ENCOUNTER — Encounter (HOSPITAL_COMMUNITY): Payer: Self-pay | Admitting: Emergency Medicine

## 2013-06-08 ENCOUNTER — Emergency Department (INDEPENDENT_AMBULATORY_CARE_PROVIDER_SITE_OTHER)
Admission: EM | Admit: 2013-06-08 | Discharge: 2013-06-08 | Disposition: A | Discharge status: Home / Self Care | Payer: Medicare Other | Source: Home / Self Care | Attending: Family Medicine | Admitting: Family Medicine

## 2013-06-08 DIAGNOSIS — R519 Headache, unspecified: Secondary | ICD-10-CM

## 2013-06-08 DIAGNOSIS — R51 Headache: Secondary | ICD-10-CM

## 2013-06-08 MED ORDER — CYCLOBENZAPRINE HCL 5 MG PO TABS: ORAL_TABLET | ORAL | Status: DC

## 2013-06-08 MED ORDER — FLUTICASONE PROPIONATE 50 MCG/ACT NA SUSP: 1.0000 | Freq: Two times a day (BID) | NASAL | Status: DC

## 2013-06-08 MED ORDER — METHYLPREDNISOLONE ACETATE 40 MG/ML IJ SUSP
80.0000 mg | Freq: Once | INTRAMUSCULAR | Status: AC
  Administered 2013-06-08: 80 mg via INTRAMUSCULAR

## 2013-06-08 MED ORDER — METHYLPREDNISOLONE ACETATE 80 MG/ML IJ SUSP
INTRAMUSCULAR | Status: AC
  Filled 2013-06-08: qty 1

## 2013-06-08 NOTE — ED Provider Notes (Addendum)
CSN: [id number]     Arrival date & time 06/08/13  1347 History   First MD Initiated Contact with Patient 06/08/13 [id number]     Chief Complaint  Patient presents with  . Headache   (Consider location/radiation/quality/duration/timing/severity/associated sxs/prior Treatment) Patient is a 48 y.o. female presenting with headaches. The history is provided by the patient.  Headache Pain location:  Frontal Quality:  Sharp Radiates to:  L neck and R neck Onset quality:  Gradual Duration:  2 days Progression:  Unchanged Chronicity:  New Similar to prior headaches: no   Associated symptoms: congestion, drainage, facial pain and sinus pressure   Associated symptoms: no abdominal pain, no back pain, no blurred vision, no pain, no fever, no loss of balance, no numbness, no photophobia and no vomiting     Past Medical History  Diagnosis Date  . Bipolar 1 disorder   . Depression    Past Surgical History  Procedure Laterality Date  . Carpal tunnel release Bilateral 1999   Family History  Problem Relation Age of Onset  . Heart attack Mother   . Heart attack Father    History  Substance Use Topics  . Smoking status: Current Every Day Smoker -- 0.50 packs/day    Types: Cigarettes  . Smokeless tobacco: Not on file  . Alcohol Use: No   OB History   Grav Para Term Preterm Abortions TAB SAB Ect Mult Living                 Review of Systems  Constitutional: Negative.  Negative for fever.  HENT: Positive for congestion, postnasal drip, rhinorrhea and sinus pressure.   Eyes: Negative for blurred vision, photophobia and pain.  Gastrointestinal: Negative for vomiting and abdominal pain.  Musculoskeletal: Negative for back pain.  Neurological: Positive for headaches. Negative for numbness and loss of balance.    Allergies  Review of patient's allergies indicates no known allergies.  Home Medications   Current Outpatient Rx  Name  Route  Sig  Dispense  Refill  . acetaminophen-codeine  (TYLENOL #3) 300-30 MG per tablet   Oral   Take 1 tablet by mouth every 4 (four) hours as needed for moderate pain.   60 tablet   0   . albuterol (PROVENTIL HFA;VENTOLIN HFA) 108 (90 BASE) MCG/ACT inhaler   Inhalation   Inhale 2 puffs into the lungs every 6 (six) hours as needed for wheezing or shortness of breath.   1 Inhaler   3   . buPROPion (WELLBUTRIN SR) 100 MG 12 hr tablet   Oral   Take 100 mg by mouth 2 (two) times daily.         . butalbital-acetaminophen-caffeine (FIORICET) 50-325-40 MG per tablet   Oral   Take 1-2 tablets by mouth every 6 (six) hours as needed for headache.   20 tablet   0   . cyclobenzaprine (FLEXERIL) 5 MG tablet      1 tab tid for headache.   30 tablet   0   . fluticasone (FLONASE) 50 MCG/ACT nasal spray   Each Nare   Place 1 spray into both nostrils 2 (two) times daily.   1 g   2   . guaiFENesin-dextromethorphan (ROBITUSSIN DM) 100-10 MG/5ML syrup   Oral   Take 5 mLs by mouth every 4 (four) hours as needed for cough.   118 mL   0   . predniSONE (DELTASONE) 10 MG tablet   Oral   Take 1 tablet (10 mg total)  by mouth daily with breakfast.   10 tablet   0   . risperiDONE (RISPERDAL) 0.5 MG tablet   Oral   Take 0.5 mg by mouth 2 (two) times daily.         . sertraline (ZOLOFT) 100 MG tablet   Oral   Take 200 mg by mouth daily.          BP 119/80  Pulse 100  Temp(Src) 97.1 F (36.2 C) (Oral)  Resp 20  SpO2 95% Physical Exam  Nursing note and vitals reviewed. Constitutional: She is oriented to person, place, and time. She appears well-developed and well-nourished.  HENT:  Head: Normocephalic.  Right Ear: External ear normal.  Left Ear: External ear normal.  Mouth/Throat: Oropharynx is clear and moist.  Eyes: Conjunctivae are normal. Pupils are equal, round, and reactive to light.  Neck: Normal range of motion. Neck supple.  Lymphadenopathy:    She has no cervical adenopathy.  Neurological: She is alert and  oriented to person, place, and time. No cranial nerve deficit. Coordination normal.  Skin: Skin is warm.    ED Course  Procedures (including critical care time) Labs Review Labs Reviewed - No data to display Imaging Review No results found.   MDM   1. Sinus headache        Linna HoffJames D Kindl, MD 06/08/13 1551  Linna HoffJames D Kindl, MD 06/09/13 2055

## 2013-06-08 NOTE — ED Notes (Signed)
Pt  Reports  symptoms  Of  Headache   With  Some  Photo sensitivity     Slight  Nausea    No  Vomiting    Pt  Reports     She woke  Up  With the  Symptoms      2  Days  Ago  She  Reports   Has  Headaches  Before  Usually  releived  By [employer]

## 2013-06-08 NOTE — ED Notes (Signed)
Pt     Discussed  Plan of  Care  With  Dr Artis Flockkindl prior  To  Discharge

## 2013-06-17 ENCOUNTER — Ambulatory Visit: Payer: Medicare Other | Admitting: Internal Medicine

## 2013-07-08 ENCOUNTER — Ambulatory Visit: Payer: Medicare Other | Admitting: Internal Medicine

## 2013-11-23 ENCOUNTER — Other Ambulatory Visit: Payer: Self-pay | Admitting: Internal Medicine

## 2014-01-17 ENCOUNTER — Ambulatory Visit: Payer: Medicare Other | Admitting: Internal Medicine

## 2014-03-09 ENCOUNTER — Telehealth: Payer: Self-pay | Admitting: Internal Medicine

## 2014-03-09 NOTE — Telephone Encounter (Signed)
M[facility]isa [facility] calling to follow up on forms for pt's oxygen equipment, [id number]. Form was faxed over on 03/02/14 and was signed and dated by PCP but no questions were answered. Misty StanleyLisa is faxing forms back to filled out, please contact her when forms ready.

## 2014-03-22 ENCOUNTER — Ambulatory Visit (INDEPENDENT_AMBULATORY_CARE_PROVIDER_SITE_OTHER): Payer: Medicare Other

## 2014-03-22 ENCOUNTER — Ambulatory Visit (INDEPENDENT_AMBULATORY_CARE_PROVIDER_SITE_OTHER): Payer: Medicare Other | Admitting: Podiatry

## 2014-03-22 VITALS — BP 138/87 | HR 80 | Resp 16

## 2014-03-22 DIAGNOSIS — M2012 Hallux valgus (acquired), left foot: Secondary | ICD-10-CM

## 2014-03-22 DIAGNOSIS — M21612 Bunion of left foot: Secondary | ICD-10-CM

## 2014-03-22 DIAGNOSIS — M722 Plantar fascial fibromatosis: Secondary | ICD-10-CM

## 2014-03-22 MED ORDER — DICLOFENAC SODIUM 75 MG PO TBEC: 75.0000 mg | DELAYED_RELEASE_TABLET | Freq: Two times a day (BID) | ORAL | Status: DC

## 2014-03-22 MED ORDER — TRIAMCINOLONE ACETONIDE 10 MG/ML IJ SUSP
10.0000 mg | Freq: Once | INTRAMUSCULAR | Status: AC
  Administered 2014-03-22: 10 mg

## 2014-03-22 NOTE — Progress Notes (Signed)
Subjective:     Patient ID: Linda Nicholson, female   DOB: 05/10/1965, 49 y.o.   MRN: [id number]  HPI patient states my right heel has been hurting me horrible for the last few weeks and my left bunion really bothers me and I know I need to daily corrected some day. States the heel is making it hard for her to ambulate   Review of Systems  All other systems reviewed and are negative.      Objective:   Physical Exam  Constitutional: She is oriented to person, place, and time.  Cardiovascular: Intact distal pulses.   Musculoskeletal: Normal range of motion.  Neurological: She is oriented to person, place, and time.  Skin: Skin is warm.  Nursing note and vitals reviewed.  neurovascular status found to be intact with muscle strength adequate and range of motion subtalar and midtarsal joint found to be normal. Patient's found to have good digital perfusion is well oriented 3 and has severe discomfort in the right plantar heel at the insertion of the tendon into the calcaneus and has large hyperostosis medial aspect first metatarsal head left with redness and discomfort noted     Assessment:     Acute plantar fasciitis right with structural bunion deformity left    Plan:     H&P and x-rays reviewed and today I injected the right plantar fascia 3 mg Kenalog 5 mg Xylocaine and I discussed correction of the left bunion which we may do at one time in the future. Dispensed fascial brace placed on diclofenac 75 mg twice a day and reappoint in 3 weeks

## 2014-03-22 NOTE — Patient Instructions (Signed)

## 2014-03-22 NOTE — Progress Notes (Signed)
   Subjective:    Patient ID: Linda Nicholson, female    DOB: [birth year], 49 y.o.   MRN: [id number]  HPI  Pt presents with right heel pain, ongoing for 2 weeks. Worsens with walking and prolonged standing  Review of Systems  Allergic/Immunologic: Positive for environmental allergies.  All other systems reviewed and are negative.      Objective:   Physical Exam        Assessment & Plan:

## 2014-04-12 ENCOUNTER — Encounter: Payer: Self-pay | Admitting: Podiatry

## 2014-04-12 ENCOUNTER — Ambulatory Visit (INDEPENDENT_AMBULATORY_CARE_PROVIDER_SITE_OTHER): Payer: Medicare Other | Admitting: Podiatry

## 2014-04-12 VITALS — BP 134/78 | HR 86 | Resp 16

## 2014-04-12 DIAGNOSIS — M722 Plantar fascial fibromatosis: Secondary | ICD-10-CM | POA: Diagnosis not present

## 2014-04-12 MED ORDER — TRIAMCINOLONE ACETONIDE 10 MG/ML IJ SUSP
10.0000 mg | Freq: Once | INTRAMUSCULAR | Status: AC
  Administered 2014-04-12: 10 mg

## 2014-04-12 NOTE — Progress Notes (Signed)
Subjective:     Patient ID: Linda Nicholson, female   DOB: 1965/09/19, 49 y.o.   MRN: [id number]  HPI patient states my heel on the right foot has been awful and making it hard for me to bear weight   Review of Systems     Objective:   Physical Exam Neurovascular status intact with severe discomfort right plantar fascial area at the insertion of the tendon into the calcaneus    Assessment:     Acute plantar fasciitis right    Plan:     I reinjected the plantar fascia right 3 mg Kenalog 5 mill grams Xylocaine and dispensed air fracture walker to reduce all plantar pressure. She needs orthotics but cannot afford at this time but we will get her into a over-the-counter device to try to give her support

## 2014-05-21 ENCOUNTER — Emergency Department (HOSPITAL_COMMUNITY)
Admission: EM | Admit: 2014-05-21 | Discharge: 2014-05-22 | Disposition: A | Discharge status: Home / Self Care | Payer: Medicare Other | Attending: Emergency Medicine | Admitting: Emergency Medicine

## 2014-05-21 ENCOUNTER — Encounter (HOSPITAL_COMMUNITY): Payer: Self-pay | Admitting: Emergency Medicine

## 2014-05-21 DIAGNOSIS — E876 Hypokalemia: Secondary | ICD-10-CM | POA: Diagnosis not present

## 2014-05-21 DIAGNOSIS — T50905A Adverse effect of unspecified drugs, medicaments and biological substances, initial encounter: Secondary | ICD-10-CM

## 2014-05-21 DIAGNOSIS — R197 Diarrhea, unspecified: Secondary | ICD-10-CM | POA: Diagnosis not present

## 2014-05-21 DIAGNOSIS — R112 Nausea with vomiting, unspecified: Secondary | ICD-10-CM | POA: Insufficient documentation

## 2014-05-21 DIAGNOSIS — R61 Generalized hyperhidrosis: Secondary | ICD-10-CM | POA: Diagnosis not present

## 2014-05-21 DIAGNOSIS — R064 Hyperventilation: Secondary | ICD-10-CM

## 2014-05-21 DIAGNOSIS — F419 Anxiety disorder, unspecified: Secondary | ICD-10-CM | POA: Insufficient documentation

## 2014-05-21 DIAGNOSIS — T391X5A Adverse effect of 4-Aminophenol derivatives, initial encounter: Secondary | ICD-10-CM | POA: Diagnosis not present

## 2014-05-21 DIAGNOSIS — Z791 Long term (current) use of non-steroidal anti-inflammatories (NSAID): Secondary | ICD-10-CM | POA: Diagnosis not present

## 2014-05-21 DIAGNOSIS — Z7951 Long term (current) use of inhaled steroids: Secondary | ICD-10-CM | POA: Diagnosis not present

## 2014-05-21 DIAGNOSIS — Z79899 Other long term (current) drug therapy: Secondary | ICD-10-CM | POA: Diagnosis not present

## 2014-05-21 DIAGNOSIS — R0789 Other chest pain: Secondary | ICD-10-CM

## 2014-05-21 DIAGNOSIS — Z7952 Long term (current) use of systemic steroids: Secondary | ICD-10-CM | POA: Insufficient documentation

## 2014-05-21 DIAGNOSIS — F319 Bipolar disorder, unspecified: Secondary | ICD-10-CM | POA: Diagnosis not present

## 2014-05-21 DIAGNOSIS — Z720 Tobacco use: Secondary | ICD-10-CM | POA: Insufficient documentation

## 2014-05-21 HISTORY — DX: Anxiety disorder, unspecified: F41.9

## 2014-05-21 NOTE — ED Notes (Signed)
[facility] Expected date:  Expected time:  Means of arrival:  Comments: EMS N/V anxiety

## 2014-05-21 NOTE — ED Notes (Signed)
Per ems pt from, home. Co NV for 1 wk. Hx anxiety. Per ems pt reported taking 16 Mucinex due to non relief. Pt believe that she is having reaction to the excessive intake of Mucinex. No hives, or pruritis, or SOB. Pt is alert and oriented , NAD. Pt denies SI.

## 2014-05-21 NOTE — ED Notes (Signed)
Bed: WA07 Expected date:  Expected time:  Means of arrival:  Comments: Hod for hall A

## 2014-05-21 NOTE — ED Notes (Signed)
MD at bedside. 

## 2014-05-22 ENCOUNTER — Emergency Department (HOSPITAL_COMMUNITY): Payer: Medicare Other

## 2014-05-22 LAB — URINALYSIS, ROUTINE W REFLEX MICROSCOPIC
Bilirubin Urine: NEGATIVE
GLUCOSE, UA: NEGATIVE mg/dL
KETONES UR: NEGATIVE mg/dL
Leukocytes, UA: NEGATIVE
Nitrite: NEGATIVE
PH: 6 (ref 5.0–8.0)
Protein, ur: NEGATIVE mg/dL
SPECIFIC GRAVITY, URINE: 1.002 — AB (ref 1.005–1.030)
Urobilinogen, UA: 0.2 mg/dL (ref 0.0–1.0)

## 2014-05-22 LAB — COMPREHENSIVE METABOLIC PANEL
ALBUMIN: 3.7 g/dL (ref 3.5–5.2)
ALT: 23 U/L (ref 0–35)
AST: 28 U/L (ref 0–37)
Alkaline Phosphatase: 88 U/L (ref 39–117)
Anion gap: 14 (ref 5–15)
BILIRUBIN TOTAL: 0.4 mg/dL (ref 0.3–1.2)
BUN: <5 mg/dL — ABNORMAL LOW (ref 6–23)
CALCIUM: 7.7 mg/dL — AB (ref 8.4–10.5)
CHLORIDE: 92 mmol/L — AB (ref 96–112)
CO2: 16 mmol/L — ABNORMAL LOW (ref 19–32)
CREATININE: 0.69 mg/dL (ref 0.50–1.10)
GFR calc Af Amer: >90 mL/min (ref >90)
GFR calc non Af Amer: >90 mL/min (ref >90)
Glucose, Bld: 170 mg/dL — ABNORMAL HIGH (ref 70–99)
POTASSIUM: 3 mmol/L — AB (ref 3.5–5.1)
SODIUM: 122 mmol/L — AB (ref 135–145)
TOTAL PROTEIN: 6.9 g/dL (ref 6.0–8.3)

## 2014-05-22 LAB — I-STAT TROPONIN, ED
Troponin i, poc: 0.01 ng/mL (ref 0.00–0.08)
Troponin i, poc: 0.01 ng/mL (ref 0.00–0.08)

## 2014-05-22 LAB — URINE MICROSCOPIC-ADD ON

## 2014-05-22 LAB — CBC
HCT: 39.7 % (ref 36.0–46.0)
Hemoglobin: 13.8 g/dL (ref 12.0–15.0)
MCH: 29.8 pg (ref 26.0–34.0)
MCHC: 34.8 g/dL (ref 30.0–36.0)
MCV: 85.7 fL (ref 78.0–100.0)
PLATELETS: 336 10*3/uL (ref 150–400)
RBC: 4.63 MIL/uL (ref 3.87–5.11)
RDW: 13.5 % (ref 11.5–15.5)
WBC: 20.4 10*3/uL — AB (ref 4.0–10.5)

## 2014-05-22 LAB — SALICYLATE LEVEL: Salicylate Lvl: <4 mg/dL (ref 2.8–20.0)

## 2014-05-22 LAB — ACETAMINOPHEN LEVEL: Acetaminophen (Tylenol), Serum: <10 ug/mL — ABNORMAL LOW (ref 10–30)

## 2014-05-22 MED ORDER — POTASSIUM CHLORIDE 10 MEQ/100ML IV SOLN
10.0000 meq | INTRAVENOUS | Status: AC
  Administered 2014-05-22 (×2): 10 meq via INTRAVENOUS
  Filled 2014-05-22 (×2): qty 100

## 2014-05-22 MED ORDER — POTASSIUM CHLORIDE CRYS ER 20 MEQ PO TBCR
40.0000 meq | EXTENDED_RELEASE_TABLET | Freq: Once | ORAL | Status: AC
  Administered 2014-05-22: 40 meq via ORAL
  Filled 2014-05-22: qty 2

## 2014-05-22 MED ORDER — KETOROLAC TROMETHAMINE 30 MG/ML IJ SOLN
30.0000 mg | Freq: Once | INTRAMUSCULAR | Status: AC
  Administered 2014-05-22: 30 mg via INTRAVENOUS
  Filled 2014-05-22: qty 1

## 2014-05-22 MED ORDER — SODIUM CHLORIDE 0.9 % IV BOLUS (SEPSIS)
1000.0000 mL | Freq: Once | INTRAVENOUS | Status: AC
  Administered 2014-05-22: 1000 mL via INTRAVENOUS

## 2014-05-22 MED ORDER — POTASSIUM CHLORIDE CRYS ER 20 MEQ PO TBCR: 20.0000 meq | EXTENDED_RELEASE_TABLET | Freq: Two times a day (BID) | ORAL | Status: DC

## 2014-05-22 MED ORDER — METOCLOPRAMIDE HCL 5 MG/ML IJ SOLN
10.0000 mg | Freq: Once | INTRAMUSCULAR | Status: AC
  Administered 2014-05-22: 10 mg via INTRAVENOUS
  Filled 2014-05-22: qty 2

## 2014-05-22 MED ORDER — LORAZEPAM 2 MG/ML IJ SOLN
1.0000 mg | Freq: Once | INTRAMUSCULAR | Status: AC
  Administered 2014-05-22: 1 mg via INTRAVENOUS
  Filled 2014-05-22: qty 1

## 2014-05-22 MED ORDER — DIPHENHYDRAMINE HCL 50 MG/ML IJ SOLN
25.0000 mg | Freq: Once | INTRAMUSCULAR | Status: AC
  Administered 2014-05-22: 25 mg via INTRAVENOUS
  Filled 2014-05-22: qty 1

## 2014-05-22 MED ORDER — ONDANSETRON HCL 4 MG PO TABS: 4.0000 mg | ORAL_TABLET | Freq: Three times a day (TID) | ORAL | Status: DC | PRN

## 2014-05-22 NOTE — ED Notes (Addendum)
Pt ambulated to BR with pump after hanging Potassium fld. Noted on pump Potassium infusing at 13200ml/hr and NS infusing at 16450ml/hr without difficulty. Upon returned to room noted approximately 20 ml of fld in bag and IV pump beeping. Dr. Lynelle DoctorKnapp informed. Pt instructed not to push any buttons on the pump d/t risk of death. Verbalized understanding.

## 2014-05-22 NOTE — ED Provider Notes (Signed)
CSN: [id number]     Arrival date & time 05/21/14  2257 History   First MD Initiated Contact with Patient 05/21/14 2338     Chief Complaint  Patient presents with  . emesis/ anxiety      (Consider location/radiation/quality/duration/timing/severity/associated sxs/prior Treatment) HPI  Patient reports she has been having nausea, vomiting and diarrhea for the past week. She states she's  having vomiting 6-8 times a day and diarrhea which she describes as loose about 10 times a day. She also reports she has had a cough for several days that is dry. She states she's been taking Mucinex for the cough. She states she took 59 Mucinex today and has taken 16 Mucinex this week. She cannot tell me whether she took the extended release Mucinex or Mucinex that was combined with other ingredients.. She states tonight she had acute onset of left sided chest pain that is sharp and waxes and wanes. She states she's never had this before. She denies eating anything that could've made her sick. She states she's had several sick contacts who have the flu. She states she had her flu shot this year.  Family history mother had coronary artery disease  PCP  Dr Hyman Hopes  Past Medical History  Diagnosis Date  . Bipolar 1 disorder   . Depression   . Anxiety    Past Surgical History  Procedure Laterality Date  . Carpal tunnel release Bilateral 1999   Family History  Problem Relation Age of Onset  . Heart attack Mother   . Heart attack Father    History  Substance Use Topics  . Smoking status: Current Every Day Smoker -- 0.50 packs/day    Types: Cigarettes  . Smokeless tobacco: Not on file  . Alcohol Use: No   Patient states she quit smoking 1 year ago Patient states she is on disability for back problems   OB History    No data available     Review of Systems  All other systems reviewed and are negative.     Allergies  Review of patient's allergies indicates no known allergies.  Home  Medications   Prior to Admission medications   Medication Sig Start Date End Date Taking? Authorizing Provider  albuterol (PROVENTIL HFA;VENTOLIN HFA) 108 (90 BASE) MCG/ACT inhaler Inhale 2 puffs into the lungs every 6 (six) hours as needed for wheezing or shortness of breath. 04/08/13  Yes Olugbemiga E Hyman Hopes, MD  guaiFENesin (MUCINEX) 600 MG 12 hr tablet Take 1,200 mg by mouth 2 (two) times daily.    Yes Historical Provider, MD  risperiDONE (RISPERDAL) 0.5 MG tablet Take 0.5 mg by mouth 2 (two) times daily.   Yes Historical Provider, MD  sertraline (ZOLOFT) 100 MG tablet Take 200 mg by mouth daily.   Yes Historical Provider, MD  buPROPion (WELLBUTRIN SR) 100 MG 12 hr tablet Take 100 mg by mouth 2 (two) times daily.    Historical Provider, MD  cyclobenzaprine (FLEXERIL) 5 MG tablet 1 tab tid for headache. 06/08/13   Linna Hoff, MD  diclofenac (VOLTAREN) 75 MG EC tablet Take 1 tablet (75 mg total) by mouth 2 (two) times daily. 03/22/14   Lenn Sink, DPM  fluticasone (FLONASE) 50 MCG/ACT nasal spray Place 1 spray into both nostrils 2 (two) times daily. 06/08/13   Linna Hoff, MD  guaiFENesin-dextromethorphan (ROBITUSSIN DM) 100-10 MG/5ML syrup Take 5 mLs by mouth every 4 (four) hours as needed for cough. 03/18/13   Quentin Angst, MD  predniSONE (DELTASONE) 10 MG tablet Take 1 tablet (10 mg total) by mouth daily with breakfast. 04/08/13   Quentin Angst, MD   BP 135/70 mmHg  Pulse 98  Temp(Src) 97.2 F (36.2 C)  Resp 40  SpO2 100%  Vital signs normal   Physical Exam  Constitutional: She is oriented to person, place, and time. She appears well-developed and well-nourished.  Non-toxic appearance. She does not appear ill. She appears distressed.  Hyperventilating, grasping her chest and pressing, she keeps repetitively saying "just treat my blood pressure", although she has been reassured several times that it is normal.  HENT:  Head: Normocephalic and atraumatic.  Right Ear:  External ear normal.  Left Ear: External ear normal.  Nose: Nose normal. No mucosal edema or rhinorrhea.  Mouth/Throat: Mucous membranes are normal. No dental abscesses or uvula swelling.  Mucous membranes dry  Eyes: Conjunctivae and EOM are normal. Pupils are equal, round, and reactive to light.  Neck: Normal range of motion and full passive range of motion without pain. Neck supple.  Cardiovascular: Normal rate, regular rhythm and normal heart sounds.  Exam reveals no gallop and no friction rub.   No murmur heard. Pulmonary/Chest: Effort normal and breath sounds normal. No respiratory distress. She has no wheezes. She has no rhonchi. She has no rales. She exhibits tenderness. She exhibits no crepitus.    Patient's chest is tender to palpation  Abdominal: Soft. Normal appearance and bowel sounds are normal. She exhibits no distension. There is no tenderness. There is no rebound and no guarding.  Musculoskeletal: Normal range of motion. She exhibits no edema or tenderness.  Moves all extremities well.   Neurological: She is alert and oriented to person, place, and time. She has normal strength. No cranial nerve deficit.  Skin: Skin is warm and intact. No rash noted. She is diaphoretic. No erythema. No pallor.  Psychiatric: She has a normal mood and affect. Her speech is normal and behavior is normal. Her mood appears not anxious.  Nursing note and vitals reviewed.   ED Course  Procedures (including critical care time)  Medications  potassium chloride 10 mEq in 100 mL IVPB (0 mEq Intravenous Stopped 05/22/14 0507)  sodium chloride 0.9 % bolus 1,000 mL (0 mLs Intravenous Stopped 05/22/14 0149)  sodium chloride 0.9 % bolus 1,000 mL (0 mLs Intravenous Stopped 05/22/14 0100)  metoCLOPramide (REGLAN) injection 10 mg (10 mg Intravenous Given 05/22/14 0010)  diphenhydrAMINE (BENADRYL) injection 25 mg (25 mg Intravenous Given 05/22/14 0007)  ketorolac (TORADOL) 30 MG/ML injection 30 mg (30 mg  Intravenous Given 05/22/14 0012)  LORazepam (ATIVAN) injection 1 mg (1 mg Intravenous Given 05/22/14 0007)  potassium chloride SA (K-DUR,KLOR-CON) CR tablet 40 mEq (40 mEq Oral Given 05/22/14 0448)    00:39 Patty, Poison Control states that she would expect diaphoresis and hyperventilation. She states patient should be observed for 6 hours after her last ingested dose of Mucinex. If she still having symptoms she probably had the release form. She recommends cardiac monitoring, benzos as needed. She also recommends doing acetaminophen and salicylate levels since were not sure which preparation patient took.  Pt was started on IV potassium for his hypokalemia b/o her vomiting. Nurse notes patient had disconnected her IV potassium and let it drain.   Pt was given IV fluids, IV nausea meds, and ativan. Her symptoms improved and the hyperventilation resolved and her chest pain resolved. Pt now says she took mucinex DM regular strength not extended released. We discussed only  taking the recommended dose of medications even if they are OTC.   Labs Review Results for orders placed or performed during the hospital encounter of 05/21/14  CBC  Result Value Ref Range   WBC 20.4 (H) 4.0 - 10.5 K/uL   RBC 4.63 3.87 - 5.11 MIL/uL   Hemoglobin 13.8 12.0 - 15.0 g/dL   HCT 16.139.7 09.636.0 - 04.546.0 %   MCV 85.7 78.0 - 100.0 fL   MCH 29.8 26.0 - 34.0 pg   MCHC 34.8 30.0 - 36.0 g/dL   RDW 40.913.5 81.111.5 - 91.415.5 %   Platelets 336 150 - 400 K/uL  Comprehensive metabolic panel  Result Value Ref Range   Sodium 122 (L) 135 - 145 mmol/L   Potassium 3.0 (L) 3.5 - 5.1 mmol/L   Chloride 92 (L) 96 - 112 mmol/L   CO2 16 (L) 19 - 32 mmol/L   Glucose, Bld 170 (H) 70 - 99 mg/dL   BUN <5 (L) 6 - 23 mg/dL   Creatinine, Ser 7.820.69 0.50 - 1.10 mg/dL   Calcium 7.7 (L) 8.4 - 10.5 mg/dL   Total Protein 6.9 6.0 - 8.3 g/dL   Albumin 3.7 3.5 - 5.2 g/dL   AST 28 0 - 37 U/L   ALT 23 0 - 35 U/L   Alkaline Phosphatase 88 39 - 117 U/L   Total  Bilirubin 0.4 0.3 - 1.2 mg/dL   GFR calc non Af Amer >90 >90 mL/min   GFR calc Af Amer >90 >90 mL/min   Anion gap 14 5 - 15  Urinalysis, Routine w reflex microscopic  Result Value Ref Range   Color, Urine YELLOW YELLOW   APPearance CLEAR CLEAR   Specific Gravity, Urine 1.002 (L) 1.005 - 1.030   pH 6.0 5.0 - 8.0   Glucose, UA NEGATIVE NEGATIVE mg/dL   Hgb urine dipstick LARGE (A) NEGATIVE   Bilirubin Urine NEGATIVE NEGATIVE   Ketones, ur NEGATIVE NEGATIVE mg/dL   Protein, ur NEGATIVE NEGATIVE mg/dL   Urobilinogen, UA 0.2 0.0 - 1.0 mg/dL   Nitrite NEGATIVE NEGATIVE   Leukocytes, UA NEGATIVE NEGATIVE  Salicylate level  Result Value Ref Range   Salicylate Lvl <4.0 2.8 - 20.0 mg/dL  Acetaminophen level  Result Value Ref Range   Acetaminophen (Tylenol), Serum <10.0 (L) 10 - 30 ug/mL  Urine microscopic-add on  Result Value Ref Range   Squamous Epithelial / LPF RARE RARE   WBC, UA 0-2 <3 WBC/hpf   RBC / HPF 7-10 <3 RBC/hpf  I-stat troponin, ED ([facility])  Result Value Ref Range   Troponin i, poc 0.01 0.00 - 0.08 ng/mL   Comment 3          I-stat troponin, ED  Result Value Ref Range   Troponin i, poc 0.01 0.00 - 0.08 ng/mL   Comment 3            Laboratory interpretation all normal except hypokalemia, leukocytosis    Imaging Review Dg Chest Port 1 View  05/22/2014   CLINICAL DATA:  Chest pain.  Nausea and vomiting for 1 week.  EXAM: PORTABLE CHEST - 1 VIEW  COMPARISON:  Frontal and lateral views 09/20/2008  FINDINGS: Lung volumes are low leading to crowding of bronchovascular structures. There is mild right greater than left basilar atelectasis. The cardiomediastinal contours are normal for technique. No consolidation, pleural effusion, or pneumothorax. No acute osseous abnormalities are seen.  IMPRESSION: Hypoventilatory chest with bibasilar atelectasis.   Electronically Signed   By:  Rubye Oaks M.D.   On: 05/22/2014 00:26     EKG Interpretation   Date/Time:   Saturday May 21 2014 23:20:55 EST Ventricular Rate:  93 PR Interval:  167 QRS Duration: 91 QT Interval:  396 QTC Calculation: 493 R Axis:   86 Text Interpretation:  Sinus rhythm Inferior infarct, old Since last  tracing Since last tracing rate slower (22 Oct 2008) Confirmed by [facility]   MD-I, Carlin Attridge ([id number]) on 05/21/2014 11:37:44 PM      MDM   Final diagnoses:  Medication side effect, initial encounter  Chest wall pain  Hyperventilation  Nausea vomiting and diarrhea  Hypokalemia    New Prescriptions   ONDANSETRON (ZOFRAN) 4 MG TABLET    Take 1 tablet (4 mg total) by mouth every 8 (eight) hours as needed for nausea or vomiting.   POTASSIUM CHLORIDE SA (K-DUR,KLOR-CON) 20 MEQ TABLET    Take 1 tablet (20 mEq total) by mouth 2 (two) times daily.    Plan discharge  Devoria Albe, MD, Concha Pyo, MD 05/22/14 [phone]

## 2014-05-22 NOTE — ED Notes (Signed)
Poison controlled called and given update on pt's condition. Verbalized understanding.

## 2014-05-22 NOTE — Discharge Instructions (Signed)
Drink plenty of fluids. Take the potassium pills until gone. Use the zofran for nausea or vomiting. Avoid milk until the diarrhea is gone. YOU SHOULD NEVER TAKE ANY MEDICATION EVEN OVER THE COUNTER MORE THAN IS RECOMMENDED ON THE LABEL!!! You can harm yourself by taking too much of any medication.

## 2014-05-22 NOTE — ED Notes (Signed)
Pt. ambulated to the bathroom without any distress and stated " i felt a little better."

## 2014-05-22 NOTE — ED Notes (Signed)
Joe, RN [employer] came down and asked pt about her need for transportation.  Pt reports she has a ride, so Joe, RN asked her to wait in lobby.  Pt escorted by myself to waiting room.

## 2014-05-22 NOTE — ED Notes (Signed)
Pt requesting to speak with charge nurse concerning taxi voucher. Charge nurse aware.

## 2014-05-22 NOTE — ED Notes (Signed)
Pt tried to urinate but could not get a clean catch.

## 2014-06-07 ENCOUNTER — Other Ambulatory Visit: Payer: Self-pay | Admitting: Podiatry

## 2014-06-08 ENCOUNTER — Telehealth: Payer: Self-pay | Admitting: Internal Medicine

## 2014-06-08 NOTE — Telephone Encounter (Signed)
Patient would like to change PCP

## 2014-06-14 ENCOUNTER — Encounter: Payer: Self-pay | Admitting: Family Medicine

## 2014-06-14 ENCOUNTER — Ambulatory Visit: Payer: Medicare Other | Attending: Family Medicine | Admitting: Family Medicine

## 2014-06-14 VITALS — BP 114/77 | HR 91 | Temp 98.0°F | Resp 18 | Ht 64.0 in | Wt 193.0 lb

## 2014-06-14 DIAGNOSIS — Z87891 Personal history of nicotine dependence: Secondary | ICD-10-CM | POA: Insufficient documentation

## 2014-06-14 DIAGNOSIS — Z0000 Encounter for general adult medical examination without abnormal findings: Secondary | ICD-10-CM | POA: Diagnosis not present

## 2014-06-14 DIAGNOSIS — J45909 Unspecified asthma, uncomplicated: Secondary | ICD-10-CM | POA: Diagnosis present

## 2014-06-14 DIAGNOSIS — J4520 Mild intermittent asthma, uncomplicated: Secondary | ICD-10-CM

## 2014-06-14 MED ORDER — PREDNISONE 50 MG PO TABS: 50.0000 mg | ORAL_TABLET | Freq: Every day | ORAL | Status: DC

## 2014-06-14 MED ORDER — ALBUTEROL SULFATE HFA 108 (90 BASE) MCG/ACT IN AERS: 2.0000 | INHALATION_SPRAY | Freq: Four times a day (QID) | RESPIRATORY_TRACT | Status: AC | PRN

## 2014-06-14 MED ORDER — MONTELUKAST SODIUM 10 MG PO TABS: 10.0000 mg | ORAL_TABLET | Freq: Every day | ORAL | Status: AC

## 2014-06-14 MED ORDER — LORATADINE 10 MG PO TABS: 10.0000 mg | ORAL_TABLET | Freq: Every day | ORAL | Status: DC

## 2014-06-14 NOTE — Progress Notes (Signed)
   Subjective:    Patient ID: Linda Nicholson, female    DOB: May 25, 1965, 49 y.o.   MRN: [id number] CC: asthma  HPI  Asthma Patient is here for evaluation of asthma. Patient's symptoms include L sided chest tightness. Associated symptoms include none. The patient has been suffering from these symptoms for approximately 2 years. Symptoms have been controlled since their onset. Medications used in the past to treat these symptoms include no prescription medications as she has been out of albuterol x one month. Suspected precipitants include change in seasons. Patient is awoken from sleep approximately none times per night. Patient has not required emergency room treatment for these symptoms, and has not required hospitalization. The patient has not been intubated in the past.   Former smoker: 1 PPD x 25 years quit on 03/11/2013  Review of Systems  Constitutional: Negative for fever, chills and unexpected weight change.  Respiratory: Positive for chest tightness. Negative for cough and shortness of breath.   Cardiovascular: Negative for chest pain.       Objective:   Physical Exam BP 114/77 mmHg  Pulse 91  Temp(Src) 98 F (36.7 C) (Oral)  Resp 18  Ht 5\' 4"  (1.626 m)  Wt 193 lb (87.544 kg)  BMI 33.11 kg/m2  SpO2 94%  LMP 05/24/2014 General appearance: alert, cooperative, no distress and moderately obese Ears: normal TM's and external ear canals both ears Nose: Nares normal. Septum midline. Mucosa normal. No drainage or sinus tenderness. Throat: lips, mucosa, and tongue normal; teeth and gums normal Neck: no adenopathy and thyroid not enlarged, symmetric, no tenderness/mass/nodules Lungs: clear to auscultation bilaterally Heart: regular rate and rhythm, S1, S2 normal, no murmur, click, rub or gallop  duoneb today x 1      Assessment & Plan:

## 2014-06-14 NOTE — Progress Notes (Signed)
Establish Care Complaining seasonal allergy Hx asthma

## 2014-06-14 NOTE — Assessment & Plan Note (Addendum)
A: intermittent asthma out of albuterol for 1 month P:  Intermittent asthma: Refilled albuterol  singulair 10 mg daily  claritin 10 mg daily   Short course of prednisone 50 mg once daily for 5 days

## 2014-06-14 NOTE — Assessment & Plan Note (Signed)
2. Healthcare maintenance: Due for pap, mammogram, screening colonoscopy  Mammogram ordered GI referral for screening colonoscopy.

## 2014-06-14 NOTE — Patient Instructions (Addendum)
Ms. Marcelle OverlieHolland,  Thank you for coming in today. It was a pleasure meeting you. I look forward to being your primary doctor.  1. Intermittent asthma: Refilled albuterol  singulair 10 mg daily  claritin 10 mg daily   Short course of prednisone 50 mg once daily for 5 days   2. Healthcare maintenance: Due for pap, mammogram, screening colonoscopy  Mammogram ordered GI referral for screening colonoscopy.   F/u in 4-6 weeks for pap smear   Dr. Armen PickupFunches

## 2014-06-15 ENCOUNTER — Telehealth: Payer: Self-pay | Admitting: Family Medicine

## 2014-06-15 DIAGNOSIS — J4520 Mild intermittent asthma, uncomplicated: Secondary | ICD-10-CM

## 2014-06-15 MED ORDER — CETIRIZINE HCL 10 MG PO TABS: 10.0000 mg | ORAL_TABLET | Freq: Every day | ORAL | Status: AC

## 2014-06-15 NOTE — Telephone Encounter (Signed)
Patient stated that she was prescribed loratadine (CLARITIN) 10 MG tablet, but her insurance does not cover the medication and she would like to get it changed to something else. Patient also stated that she had a Pap Smear in January of 2016 with Dr[facility]. Patient uses [facility] on [address]. Please f/u

## 2014-06-15 NOTE — Telephone Encounter (Signed)
Noted pap smear. Sent in zyrtec to replace claritin

## 2014-06-15 NOTE — Telephone Encounter (Signed)
Left voice message with information.

## 2014-08-02 ENCOUNTER — Ambulatory Visit: Payer: Medicare Other

## 2014-08-14 ENCOUNTER — Encounter: Payer: Self-pay | Admitting: Family Medicine

## 2014-08-15 ENCOUNTER — Encounter: Payer: Self-pay | Admitting: Podiatry

## 2014-08-15 ENCOUNTER — Ambulatory Visit (INDEPENDENT_AMBULATORY_CARE_PROVIDER_SITE_OTHER): Payer: Medicare Other | Admitting: Podiatry

## 2014-08-15 VITALS — BP 123/74 | HR 95 | Resp 15

## 2014-08-15 DIAGNOSIS — M722 Plantar fascial fibromatosis: Secondary | ICD-10-CM

## 2014-08-15 MED ORDER — TRIAMCINOLONE ACETONIDE 10 MG/ML IJ SUSP
10.0000 mg | Freq: Once | INTRAMUSCULAR | Status: AC
  Administered 2014-08-15: 10 mg

## 2014-08-17 NOTE — Progress Notes (Signed)
Subjective:     Patient ID: Linda Nicholson, female   DOB: 04/14/1965, 49 y.o.   MRN: [id number]  HPI patient presents stating I been having a lot of pain in my mid arch right with fluid buildup for the last several months. Do not remember specific injury but it's been gradually getting worse over the last 6 months   Review of Systems  All other systems reviewed and are negative.      Objective:   Physical Exam  Constitutional: She is oriented to person, place, and time.  Cardiovascular: Intact distal pulses.   Musculoskeletal: Normal range of motion.  Neurological: She is oriented to person, place, and time.  Skin: Skin is warm and dry.  Nursing note and vitals reviewed.  neurovascular status intact with muscle strength adequate and range of motion subtalar midtarsal joint within normal limits. Patient's noted to have pain in the mid arch area right with inflammation and fluid buildup and states that she has trouble walking first up in the morning and after periods of sitting and that it then aches and burns at times during the day. Good digital perfusion is noted and well oriented 3     Assessment:     planter fasciitis right with inflammation with no indications of neurological loss currently    Plan:     H&P and condition reviewed with patient along with x-ray. Injected the mid arch area right 3 mg Kenalog 5 mill grams Xylocaine and applied fascial brace with instructions on usage. They've instructions on physical therapy

## 2014-08-22 ENCOUNTER — Telehealth: Payer: Self-pay | Admitting: *Deleted

## 2014-08-22 ENCOUNTER — Ambulatory Visit: Payer: Medicare Other | Admitting: Podiatry

## 2014-08-22 MED ORDER — DICLOFENAC SODIUM 75 MG PO TBEC: 75.0000 mg | DELAYED_RELEASE_TABLET | Freq: Two times a day (BID) | ORAL | Status: AC

## 2014-08-22 NOTE — Telephone Encounter (Signed)
Pt request refill of Voltaren.  Dr. Charlsie Merles states refill as previously plus 1 additional, but must be seen prior to future refills.  I informed pt and she stated understanding.

## 2014-09-26 ENCOUNTER — Telehealth: Payer: Self-pay | Admitting: *Deleted

## 2014-09-26 ENCOUNTER — Encounter: Payer: Self-pay | Admitting: Family Medicine

## 2014-09-26 ENCOUNTER — Ambulatory Visit (HOSPITAL_BASED_OUTPATIENT_CLINIC_OR_DEPARTMENT_OTHER): Payer: Medicare Other | Admitting: Family Medicine

## 2014-09-26 ENCOUNTER — Ambulatory Visit (HOSPITAL_COMMUNITY)
Admission: RE | Admit: 2014-09-26 | Discharge: 2014-09-26 | Disposition: A | Discharge status: Home / Self Care | Payer: Medicare Other | Source: Ambulatory Visit | Attending: Family Medicine | Admitting: Family Medicine

## 2014-09-26 VITALS — BP 117/73 | HR 82 | Temp 98.6°F | Resp 16 | Ht 64.0 in | Wt 199.0 lb

## 2014-09-26 DIAGNOSIS — R0602 Shortness of breath: Secondary | ICD-10-CM | POA: Insufficient documentation

## 2014-09-26 DIAGNOSIS — R0789 Other chest pain: Secondary | ICD-10-CM | POA: Insufficient documentation

## 2014-09-26 DIAGNOSIS — R05 Cough: Secondary | ICD-10-CM | POA: Insufficient documentation

## 2014-09-26 DIAGNOSIS — R059 Cough, unspecified: Secondary | ICD-10-CM

## 2014-09-26 MED ORDER — OMEPRAZOLE 20 MG PO CPDR: 20.0000 mg | DELAYED_RELEASE_CAPSULE | Freq: Every day | ORAL | Status: AC

## 2014-09-26 NOTE — Telephone Encounter (Signed)
LVM to return call.

## 2014-09-26 NOTE — Assessment & Plan Note (Addendum)
A: cough x 2 months. Normal exam and no symptomatic symptoms today P: Chest x-ray ordered and normal  prilosec ordered to cover for possible GERD

## 2014-09-26 NOTE — Progress Notes (Signed)
Complaining of SHOB hx pneumonia  No Hx tobacco  Diarrhea x 2 wks

## 2014-09-26 NOTE — Progress Notes (Signed)
   Subjective:    Patient ID: Linda Nicholson, female    DOB: 1965/12/05, 49 y.o.   MRN: [id number] CC: cough x 2 months  HPI 49 yo F presents for f/u visit   1. Cough: x 2 months. Intermittently productive but mostly dry. Taking singulair and albuterol. No fever or chills. No known sick contacts. No GI upset or bitter taste in her mouth. No congestion, sneezing or sore throat. Has hx of bilateral pneumonia 1 year ago.   Current Outpatient Prescriptions on File Prior to Visit  Medication Sig Dispense Refill  . acetaminophen (TYLENOL) 325 MG tablet Take 650 mg by mouth every 6 (six) hours as needed for moderate pain.    Marland Kitchen. albuterol (PROVENTIL HFA;VENTOLIN HFA) 108 (90 BASE) MCG/ACT inhaler Inhale 2 puffs into the lungs every 6 (six) hours as needed for wheezing or shortness of breath. 1 Inhaler 11  . buPROPion (WELLBUTRIN SR) 100 MG 12 hr tablet Take 100 mg by mouth 2 (two) times daily.    . cetirizine (ZYRTEC) 10 MG tablet Take 1 tablet (10 mg total) by mouth daily. 30 tablet 11  . diclofenac (VOLTAREN) 75 MG EC tablet Take 1 tablet (75 mg total) by mouth 2 (two) times daily. 50 tablet 1  . montelukast (SINGULAIR) 10 MG tablet Take 1 tablet (10 mg total) by mouth at bedtime. 30 tablet 11  . risperiDONE (RISPERDAL) 0.5 MG tablet Take 0.5 mg by mouth 2 (two) times daily.    . sertraline (ZOLOFT) 100 MG tablet Take 200 mg by mouth daily.     No current facility-administered medications on file prior to visit.    Soc Hx: former smoker, quit  Review of Systems  Constitutional: Negative for fever, chills and unexpected weight change.  Respiratory: Positive for cough and shortness of breath. Negative for wheezing.   Cardiovascular: Negative for chest pain.  Gastrointestinal: Positive for diarrhea.  Skin: Negative for rash.  Psychiatric/Behavioral: Negative for dysphoric mood.      Objective:   Physical Exam BP 117/73 mmHg  Pulse 82  Temp(Src) 98.6 F (37 C) (Oral)  Resp 16  Ht 5\' 4"   (1.626 m)  Wt 199 lb (90.266 kg)  BMI 34.14 kg/m2  SpO2 96%  LMP 09/21/2014  Wt Readings from Last 3 Encounters:  09/26/14 199 lb (90.266 kg)  06/14/14 193 lb (87.544 kg)  04/08/13 181 lb (82.101 kg)  General appearance: alert, cooperative, no distress and mildly obese Nose: Nares normal. Septum midline. Mucosa normal. No drainage or sinus tenderness. Throat: lips, mucosa, and tongue normal; teeth and gums normal Neck: no adenopathy, supple, symmetrical, trachea midline and thyroid not enlarged, symmetric, no tenderness/mass/nodules Lungs: clear to auscultation bilaterally Heart: regular rate and rhythm, S1, S2 normal, no murmur, click, rub or gallop      Assessment & Plan:

## 2014-09-26 NOTE — Telephone Encounter (Signed)
Patient is returning phone call in regards to results, please f/u with pt.

## 2014-09-26 NOTE — Telephone Encounter (Signed)
-----   Message from Dessa PhiJosalyn Funches, MD sent at 09/26/2014  4:00 PM EDT ----- Normal CXR no evidence of pneumonia

## 2014-09-26 NOTE — Patient Instructions (Addendum)
Ms. Linda Nicholson,  Please go for CXR If there is evidence of pneumonia, I will treat you I will call you with results   Dr. Armen PickupFunches

## 2014-09-27 NOTE — Telephone Encounter (Signed)
Patient is calling back in regards to chest x ray results, please f/u

## 2014-09-28 NOTE — Telephone Encounter (Signed)
Pt aware of results 

## 2015-01-10 DEATH — deceased

## 2015-10-19 IMAGING — DX DG CHEST 1V PORT
1 series · 1 of 1 positions shown · non-contrast
Comparison: Frontal and lateral views 09/20/2008

CLINICAL DATA: Chest pain.  Nausea and vomiting for 1 week.

EXAM:
PORTABLE CHEST - 1 VIEW

[chest ap]
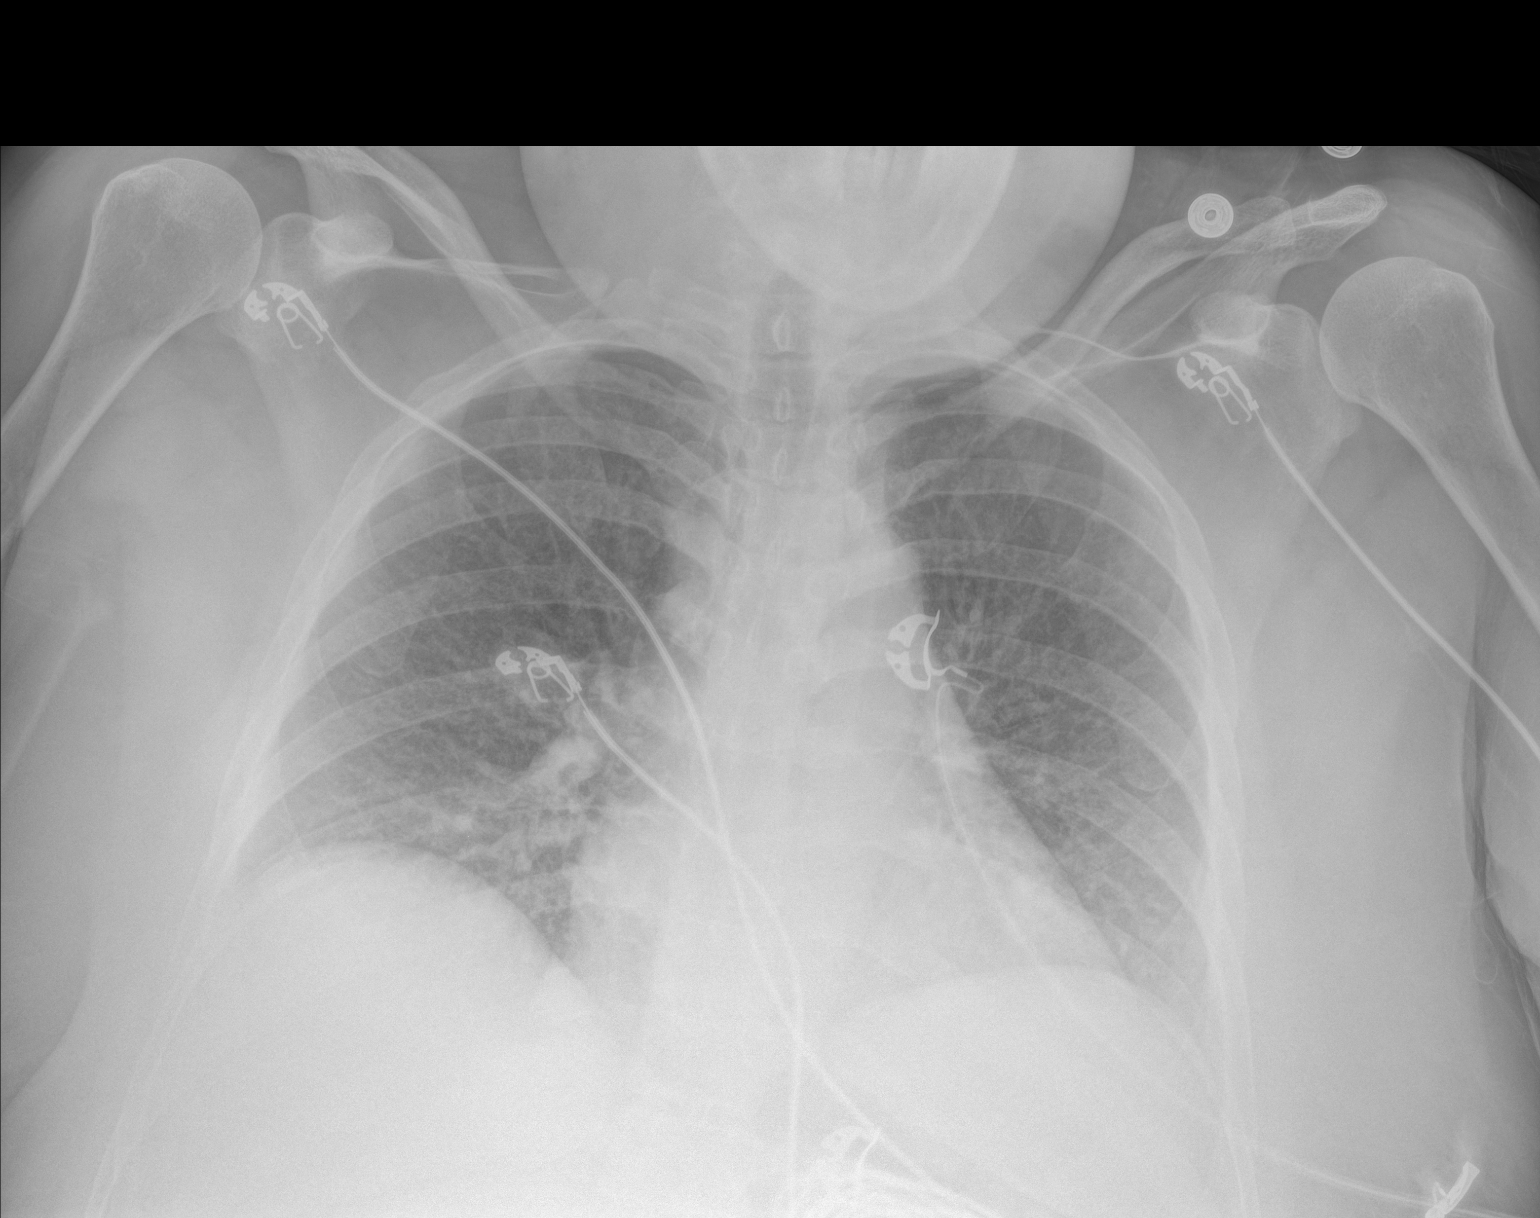

[1 of 1 positions shown; findings below may reference images not displayed]

FINDINGS: Lung volumes are low leading to crowding of bronchovascular
structures. There is mild right greater than left basilar
atelectasis. The cardiomediastinal contours are normal for
technique. No consolidation, pleural effusion, or pneumothorax. No
acute osseous abnormalities are seen.
IMPRESSION: Hypoventilatory chest with bibasilar atelectasis.

## 2016-02-23 IMAGING — DX DG CHEST 2V
2 series · 2 of 2 positions shown · non-contrast
Comparison: 05/22/2014.

CLINICAL DATA: Initial encounter for shortness of breath with chest
tightness and cough for 1 month.

EXAM:
CHEST  2 VIEW

[chest pa]
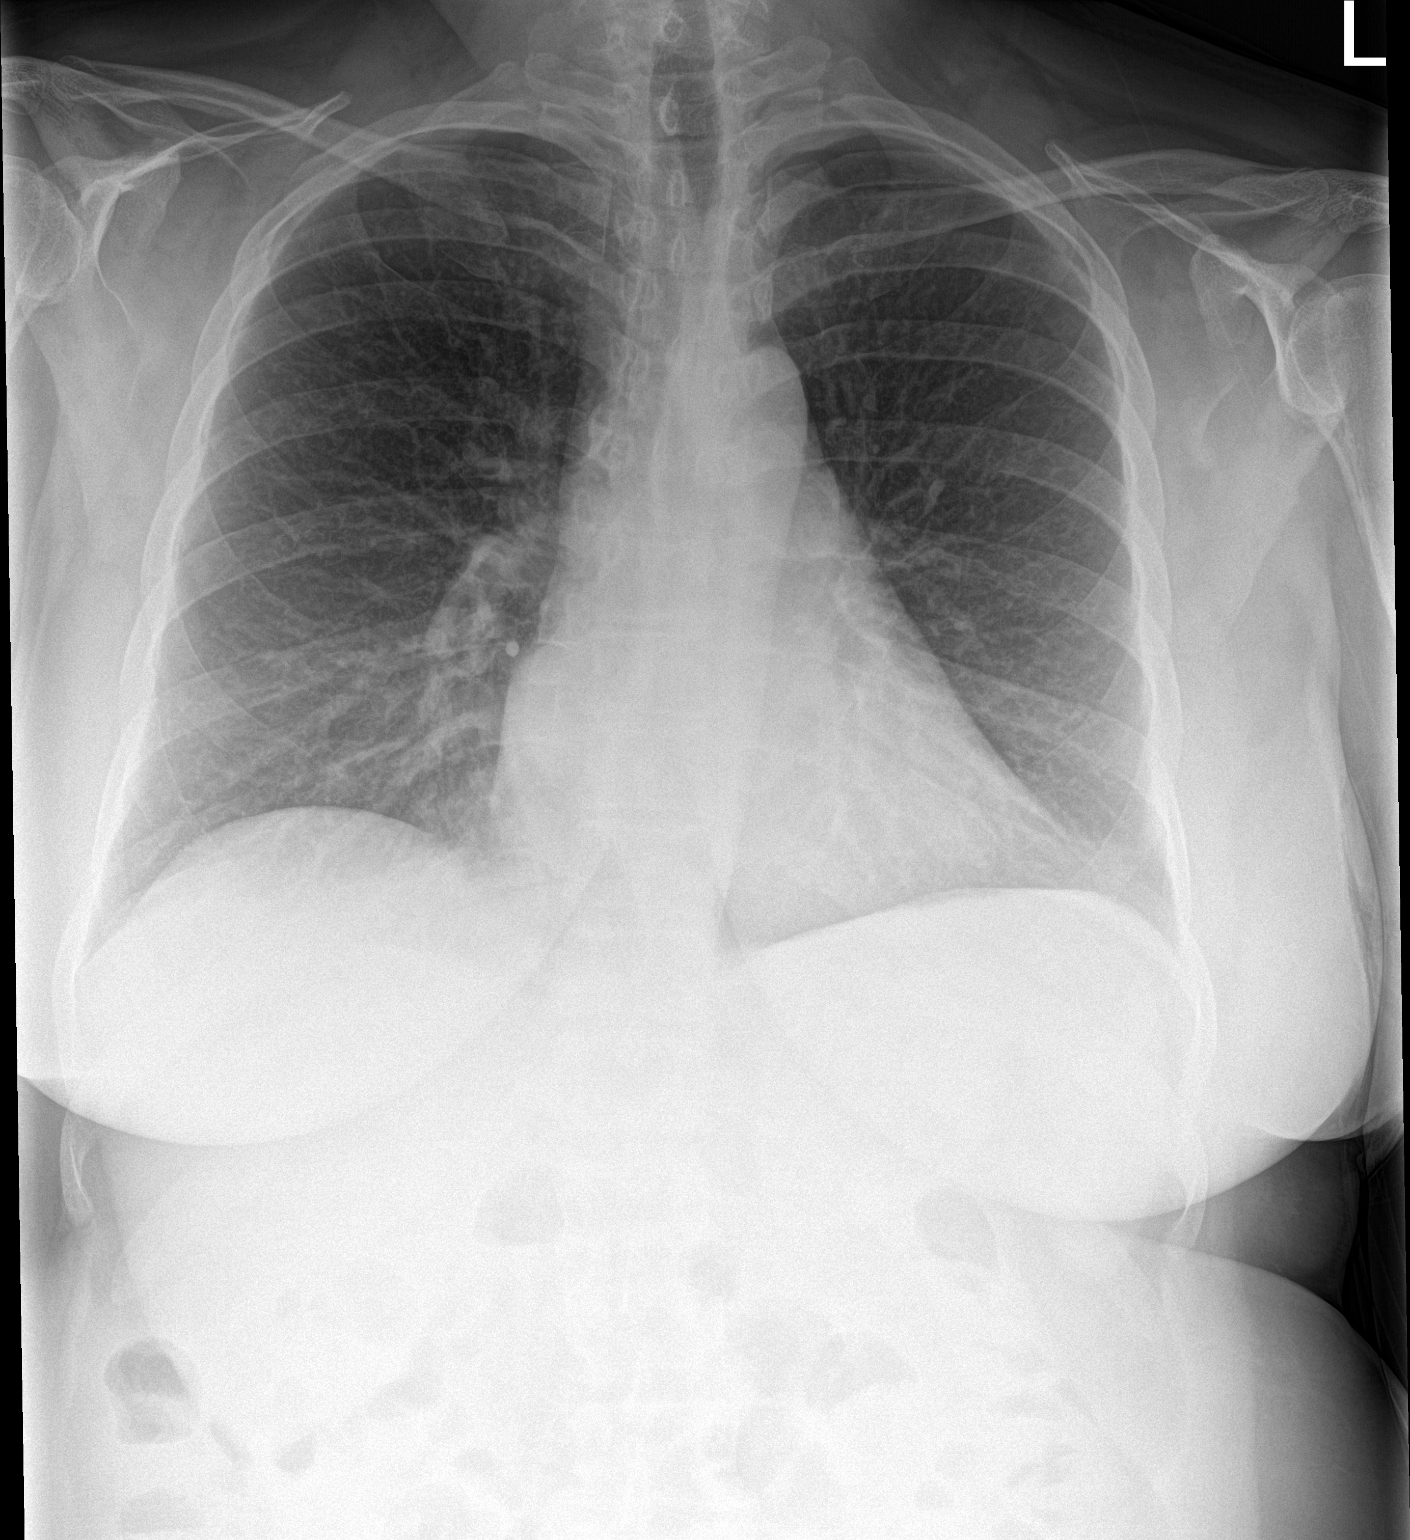

[chest lat]
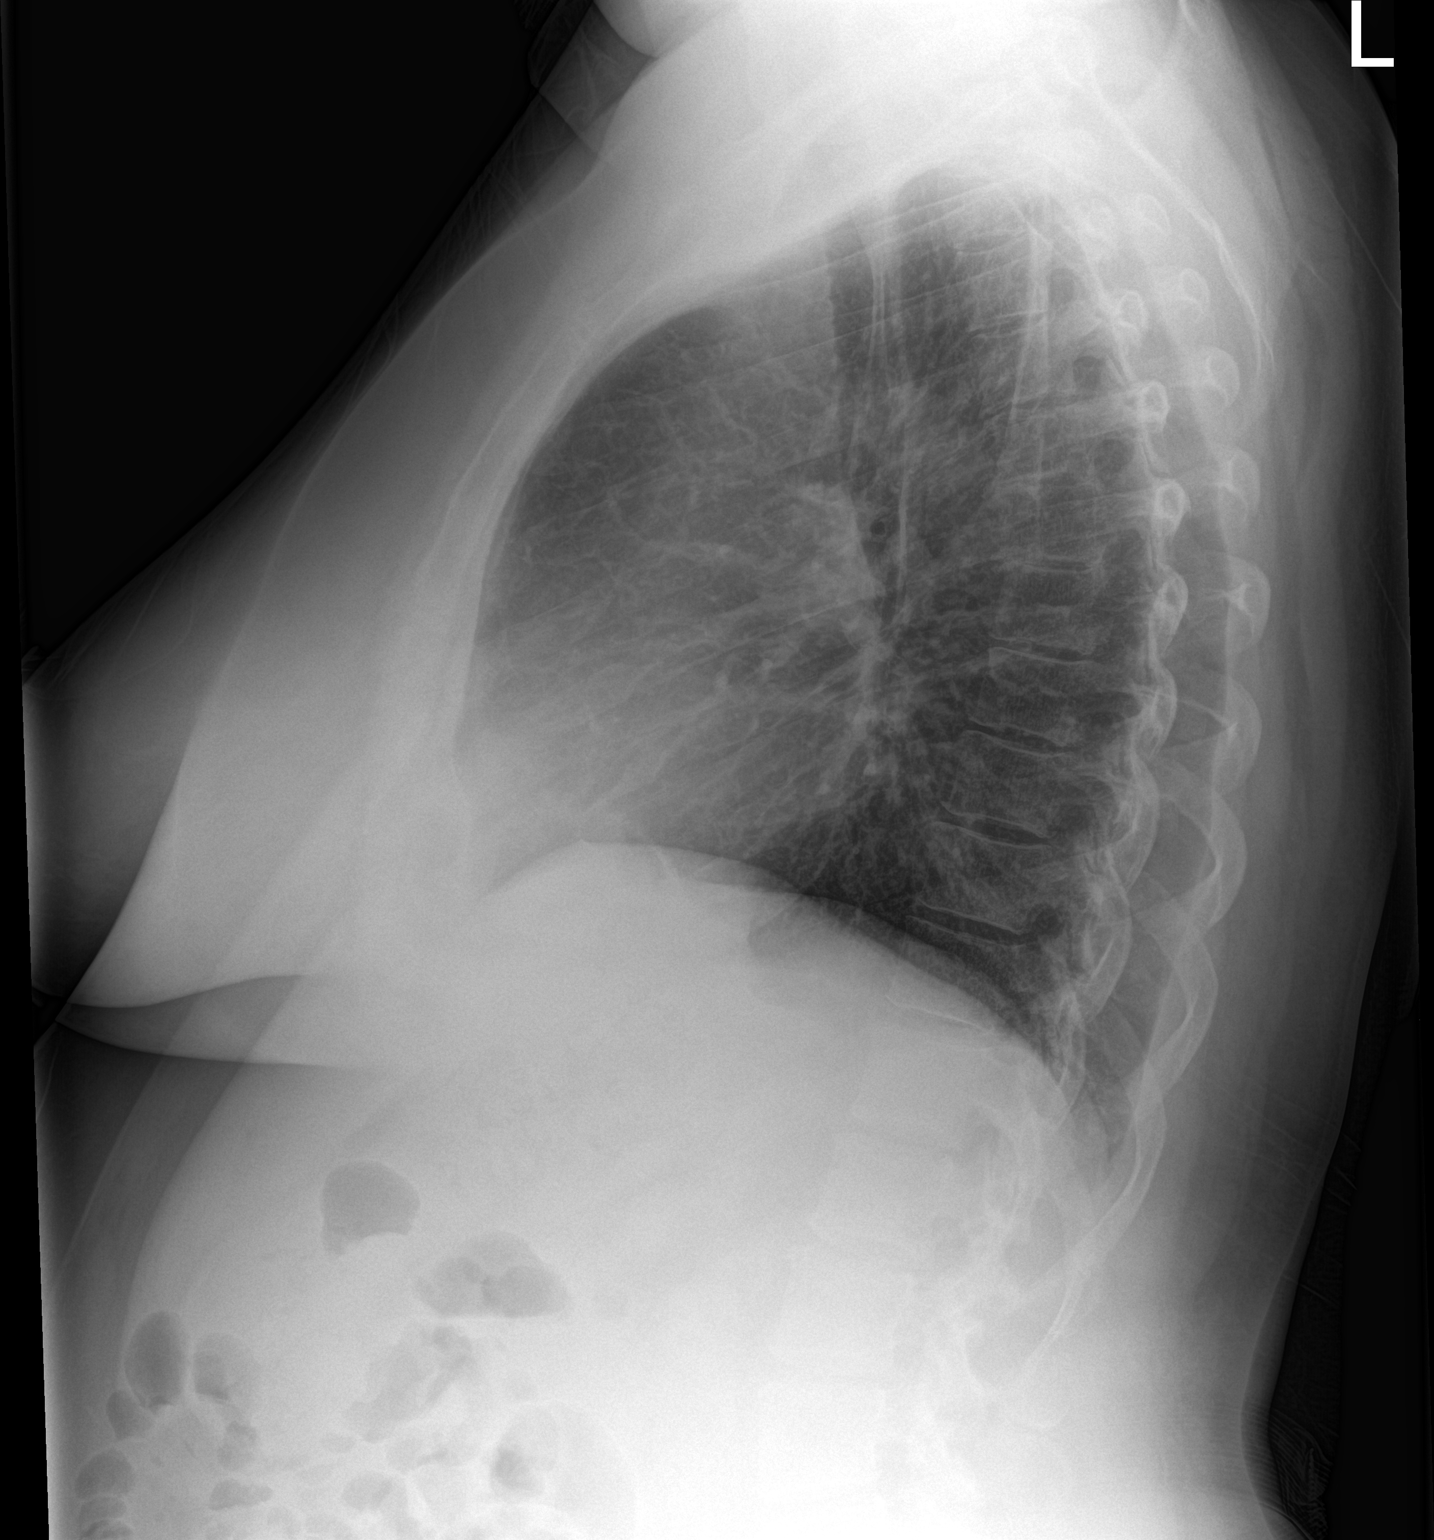

[2 of 2 positions shown; findings below may reference images not displayed]

FINDINGS: The heart size and mediastinal contours are within normal limits.
Both lungs are clear. The visualized skeletal structures are
unremarkable.
IMPRESSION: No active cardiopulmonary disease.
